# Patient Record
Sex: Male | Born: 1935 | Race: White | Hispanic: No | Marital: Married | Smoking: Former smoker
Health system: Southern US, Community
[De-identification: ages and names within clinical notes are randomized; demographics above are authoritative.]

## PROBLEM LIST (undated history)

## (undated) DIAGNOSIS — E669 Obesity, unspecified: Secondary | ICD-10-CM

## (undated) DIAGNOSIS — I255 Ischemic cardiomyopathy: Secondary | ICD-10-CM

## (undated) DIAGNOSIS — I2510 Atherosclerotic heart disease of native coronary artery without angina pectoris: Secondary | ICD-10-CM

## (undated) DIAGNOSIS — I701 Atherosclerosis of renal artery: Secondary | ICD-10-CM

## (undated) DIAGNOSIS — K219 Gastro-esophageal reflux disease without esophagitis: Secondary | ICD-10-CM

## (undated) DIAGNOSIS — I472 Ventricular tachycardia, unspecified: Secondary | ICD-10-CM

## (undated) DIAGNOSIS — Z95810 Presence of automatic (implantable) cardiac defibrillator: Secondary | ICD-10-CM

## (undated) DIAGNOSIS — E785 Hyperlipidemia, unspecified: Secondary | ICD-10-CM

## (undated) DIAGNOSIS — E1140 Type 2 diabetes mellitus with diabetic neuropathy, unspecified: Secondary | ICD-10-CM

## (undated) DIAGNOSIS — I350 Nonrheumatic aortic (valve) stenosis: Secondary | ICD-10-CM

## (undated) DIAGNOSIS — I10 Essential (primary) hypertension: Secondary | ICD-10-CM

## (undated) DIAGNOSIS — I739 Peripheral vascular disease, unspecified: Secondary | ICD-10-CM

## (undated) DIAGNOSIS — F329 Major depressive disorder, single episode, unspecified: Secondary | ICD-10-CM

## (undated) DIAGNOSIS — M1990 Unspecified osteoarthritis, unspecified site: Secondary | ICD-10-CM

## (undated) DIAGNOSIS — F32A Depression, unspecified: Secondary | ICD-10-CM

## (undated) DIAGNOSIS — J329 Chronic sinusitis, unspecified: Secondary | ICD-10-CM

## (undated) DIAGNOSIS — E049 Nontoxic goiter, unspecified: Secondary | ICD-10-CM

## (undated) HISTORY — DX: Depression, unspecified: F32.A

## (undated) HISTORY — DX: Major depressive disorder, single episode, unspecified: F32.9

## (undated) HISTORY — DX: Ischemic cardiomyopathy: I25.5

## (undated) HISTORY — DX: Gastro-esophageal reflux disease without esophagitis: K21.9

## (undated) HISTORY — DX: Ventricular tachycardia, unspecified: I47.20

## (undated) HISTORY — DX: Peripheral vascular disease, unspecified: I73.9

## (undated) HISTORY — DX: Presence of automatic (implantable) cardiac defibrillator: Z95.810

## (undated) HISTORY — PX: CORONARY ARTERY BYPASS GRAFT: SHX141

## (undated) HISTORY — DX: Type 2 diabetes mellitus with diabetic neuropathy, unspecified: E11.40

## (undated) HISTORY — DX: Unspecified osteoarthritis, unspecified site: M19.90

## (undated) HISTORY — DX: Obesity, unspecified: E66.9

## (undated) HISTORY — DX: Essential (primary) hypertension: I10

## (undated) HISTORY — PX: OTHER SURGICAL HISTORY: SHX169

## (undated) HISTORY — DX: Nontoxic goiter, unspecified: E04.9

## (undated) HISTORY — DX: Atherosclerosis of renal artery: I70.1

## (undated) HISTORY — DX: Nonrheumatic aortic (valve) stenosis: I35.0

## (undated) HISTORY — DX: Atherosclerotic heart disease of native coronary artery without angina pectoris: I25.10

## (undated) HISTORY — DX: Hyperlipidemia, unspecified: E78.5

## (undated) HISTORY — DX: Ventricular tachycardia: I47.2

## (undated) HISTORY — DX: Chronic sinusitis, unspecified: J32.9

---

## 1997-12-31 ENCOUNTER — Other Ambulatory Visit: Admission: RE | Admit: 1997-12-31 | Discharge: 1997-12-31 | Payer: Self-pay | Admitting: Internal Medicine

## 1998-01-14 ENCOUNTER — Ambulatory Visit (HOSPITAL_COMMUNITY): Admission: RE | Admit: 1998-01-14 | Discharge: 1998-01-14 | Payer: Self-pay | Admitting: Internal Medicine

## 1998-09-16 ENCOUNTER — Inpatient Hospital Stay (HOSPITAL_COMMUNITY): Admission: EM | Admit: 1998-09-16 | Discharge: 1998-09-30 | Payer: Self-pay | Admitting: Emergency Medicine

## 1998-09-16 ENCOUNTER — Encounter: Payer: Self-pay | Admitting: Internal Medicine

## 1998-09-17 ENCOUNTER — Encounter: Payer: Self-pay | Admitting: Internal Medicine

## 1998-09-18 ENCOUNTER — Encounter: Payer: Self-pay | Admitting: Cardiology

## 1998-09-18 ENCOUNTER — Encounter: Payer: Self-pay | Admitting: Internal Medicine

## 1998-09-19 ENCOUNTER — Encounter: Payer: Self-pay | Admitting: Cardiology

## 1998-09-20 ENCOUNTER — Encounter: Payer: Self-pay | Admitting: Cardiology

## 1998-09-21 ENCOUNTER — Encounter: Payer: Self-pay | Admitting: Internal Medicine

## 1998-09-23 ENCOUNTER — Encounter: Payer: Self-pay | Admitting: Internal Medicine

## 1998-09-24 ENCOUNTER — Encounter: Payer: Self-pay | Admitting: Internal Medicine

## 1998-09-25 ENCOUNTER — Encounter: Payer: Self-pay | Admitting: Internal Medicine

## 1998-09-26 ENCOUNTER — Encounter: Payer: Self-pay | Admitting: Thoracic Surgery (Cardiothoracic Vascular Surgery)

## 1998-09-27 ENCOUNTER — Encounter: Payer: Self-pay | Admitting: Thoracic Surgery (Cardiothoracic Vascular Surgery)

## 1998-11-05 ENCOUNTER — Encounter (HOSPITAL_COMMUNITY): Admission: RE | Admit: 1998-11-05 | Discharge: 1999-02-03 | Payer: Self-pay | Admitting: Cardiology

## 1998-12-18 ENCOUNTER — Encounter: Payer: Self-pay | Admitting: Emergency Medicine

## 1998-12-18 ENCOUNTER — Inpatient Hospital Stay (HOSPITAL_COMMUNITY): Admission: EM | Admit: 1998-12-18 | Discharge: 1998-12-21 | Payer: Self-pay | Admitting: Emergency Medicine

## 1998-12-19 ENCOUNTER — Encounter: Payer: Self-pay | Admitting: Cardiology

## 1998-12-31 ENCOUNTER — Encounter: Admission: RE | Admit: 1998-12-31 | Discharge: 1999-03-10 | Payer: Self-pay | Admitting: Internal Medicine

## 1999-02-04 ENCOUNTER — Encounter: Admission: RE | Admit: 1999-02-04 | Discharge: 1999-03-10 | Payer: Self-pay | Admitting: Cardiology

## 1999-03-06 ENCOUNTER — Encounter: Admission: RE | Admit: 1999-03-06 | Discharge: 1999-03-10 | Payer: Self-pay | Admitting: Internal Medicine

## 1999-03-11 ENCOUNTER — Encounter: Admission: RE | Admit: 1999-03-11 | Discharge: 1999-06-09 | Payer: Self-pay | Admitting: Cardiology

## 1999-04-01 ENCOUNTER — Encounter: Admission: RE | Admit: 1999-04-01 | Discharge: 1999-06-30 | Payer: Self-pay | Admitting: Internal Medicine

## 1999-04-28 ENCOUNTER — Encounter (HOSPITAL_COMMUNITY): Admission: RE | Admit: 1999-04-28 | Discharge: 1999-07-27 | Payer: Self-pay | Admitting: Cardiology

## 1999-07-28 ENCOUNTER — Encounter (HOSPITAL_COMMUNITY): Admission: RE | Admit: 1999-07-28 | Discharge: 1999-10-26 | Payer: Self-pay | Admitting: Cardiology

## 1999-10-27 ENCOUNTER — Encounter (HOSPITAL_COMMUNITY): Admission: RE | Admit: 1999-10-27 | Discharge: 2000-01-25 | Payer: Self-pay | Admitting: Cardiology

## 2000-01-26 ENCOUNTER — Encounter (HOSPITAL_COMMUNITY): Admission: RE | Admit: 2000-01-26 | Discharge: 2000-04-25 | Payer: Self-pay | Admitting: Cardiology

## 2000-04-26 ENCOUNTER — Encounter: Admission: RE | Admit: 2000-04-26 | Discharge: 2000-07-25 | Payer: Self-pay | Admitting: Rehabilitation

## 2000-05-03 ENCOUNTER — Encounter (INDEPENDENT_AMBULATORY_CARE_PROVIDER_SITE_OTHER): Payer: Self-pay | Admitting: *Deleted

## 2000-05-03 ENCOUNTER — Ambulatory Visit (HOSPITAL_COMMUNITY): Admission: RE | Admit: 2000-05-03 | Discharge: 2000-05-03 | Payer: Self-pay | Admitting: Gastroenterology

## 2000-06-04 ENCOUNTER — Encounter: Admission: RE | Admit: 2000-06-04 | Discharge: 2000-06-04 | Payer: Self-pay | Admitting: Internal Medicine

## 2000-06-04 ENCOUNTER — Encounter: Payer: Self-pay | Admitting: Internal Medicine

## 2000-07-26 ENCOUNTER — Encounter (HOSPITAL_COMMUNITY): Admission: RE | Admit: 2000-07-26 | Discharge: 2000-10-24 | Payer: Self-pay | Admitting: Cardiology

## 2000-08-30 ENCOUNTER — Encounter: Payer: Self-pay | Admitting: Cardiology

## 2000-08-30 ENCOUNTER — Inpatient Hospital Stay (HOSPITAL_COMMUNITY): Admission: EM | Admit: 2000-08-30 | Discharge: 2000-09-07 | Payer: Self-pay | Admitting: Emergency Medicine

## 2000-08-31 ENCOUNTER — Encounter: Payer: Self-pay | Admitting: Cardiology

## 2000-09-03 ENCOUNTER — Encounter: Payer: Self-pay | Admitting: Cardiology

## 2000-09-04 ENCOUNTER — Encounter: Payer: Self-pay | Admitting: Cardiology

## 2000-09-07 ENCOUNTER — Encounter: Payer: Self-pay | Admitting: Internal Medicine

## 2000-09-22 ENCOUNTER — Encounter: Payer: Self-pay | Admitting: Emergency Medicine

## 2000-09-22 ENCOUNTER — Emergency Department (HOSPITAL_COMMUNITY): Admission: EM | Admit: 2000-09-22 | Discharge: 2000-09-22 | Payer: Self-pay | Admitting: Emergency Medicine

## 2000-10-01 ENCOUNTER — Encounter: Payer: Self-pay | Admitting: Internal Medicine

## 2000-10-01 ENCOUNTER — Encounter: Admission: RE | Admit: 2000-10-01 | Discharge: 2000-10-01 | Payer: Self-pay | Admitting: Internal Medicine

## 2000-10-01 ENCOUNTER — Inpatient Hospital Stay (HOSPITAL_COMMUNITY): Admission: EM | Admit: 2000-10-01 | Discharge: 2000-10-06 | Payer: Self-pay | Admitting: Internal Medicine

## 2000-10-02 ENCOUNTER — Encounter: Payer: Self-pay | Admitting: Internal Medicine

## 2000-10-03 ENCOUNTER — Encounter: Payer: Self-pay | Admitting: Internal Medicine

## 2000-10-05 ENCOUNTER — Encounter: Payer: Self-pay | Admitting: Internal Medicine

## 2000-11-15 ENCOUNTER — Encounter (HOSPITAL_COMMUNITY): Admission: RE | Admit: 2000-11-15 | Discharge: 2001-02-13 | Payer: Self-pay | Admitting: Cardiology

## 2001-02-14 ENCOUNTER — Encounter (HOSPITAL_COMMUNITY): Admission: RE | Admit: 2001-02-14 | Discharge: 2001-05-15 | Payer: Self-pay | Admitting: Rehabilitation

## 2001-06-10 ENCOUNTER — Encounter (HOSPITAL_COMMUNITY): Admission: RE | Admit: 2001-06-10 | Discharge: 2001-09-08 | Payer: Self-pay | Admitting: Cardiology

## 2001-06-20 ENCOUNTER — Ambulatory Visit (HOSPITAL_COMMUNITY): Admission: RE | Admit: 2001-06-20 | Discharge: 2001-06-20 | Payer: Self-pay | Admitting: Cardiology

## 2001-06-20 ENCOUNTER — Encounter: Payer: Self-pay | Admitting: Cardiology

## 2001-09-10 ENCOUNTER — Encounter: Admission: RE | Admit: 2001-09-10 | Discharge: 2001-12-09 | Payer: Self-pay | Admitting: Cardiology

## 2001-12-10 ENCOUNTER — Encounter (HOSPITAL_COMMUNITY): Admission: RE | Admit: 2001-12-10 | Discharge: 2002-03-10 | Payer: Self-pay | Admitting: Cardiology

## 2002-03-21 ENCOUNTER — Encounter: Payer: Self-pay | Admitting: Vascular Surgery

## 2002-03-23 ENCOUNTER — Ambulatory Visit (HOSPITAL_COMMUNITY): Admission: RE | Admit: 2002-03-23 | Discharge: 2002-03-23 | Payer: Self-pay | Admitting: Vascular Surgery

## 2002-03-30 ENCOUNTER — Encounter: Payer: Self-pay | Admitting: Vascular Surgery

## 2002-03-30 ENCOUNTER — Inpatient Hospital Stay (HOSPITAL_COMMUNITY): Admission: RE | Admit: 2002-03-30 | Discharge: 2002-04-03 | Payer: Self-pay | Admitting: Vascular Surgery

## 2002-05-10 ENCOUNTER — Encounter (HOSPITAL_COMMUNITY): Admission: RE | Admit: 2002-05-10 | Discharge: 2002-08-08 | Payer: Self-pay | Admitting: Cardiology

## 2002-08-10 ENCOUNTER — Encounter (HOSPITAL_COMMUNITY): Admission: RE | Admit: 2002-08-10 | Discharge: 2002-11-08 | Payer: Self-pay | Admitting: Cardiology

## 2002-08-14 ENCOUNTER — Encounter: Payer: Self-pay | Admitting: Vascular Surgery

## 2002-08-16 ENCOUNTER — Ambulatory Visit (HOSPITAL_COMMUNITY): Admission: RE | Admit: 2002-08-16 | Discharge: 2002-08-17 | Payer: Self-pay | Admitting: Vascular Surgery

## 2002-11-09 ENCOUNTER — Encounter (HOSPITAL_COMMUNITY): Admission: RE | Admit: 2002-11-09 | Discharge: 2003-02-07 | Payer: Self-pay | Admitting: Cardiology

## 2003-01-30 ENCOUNTER — Encounter: Payer: Self-pay | Admitting: Cardiology

## 2003-01-30 ENCOUNTER — Emergency Department (HOSPITAL_COMMUNITY): Admission: EM | Admit: 2003-01-30 | Discharge: 2003-01-30 | Payer: Self-pay | Admitting: Emergency Medicine

## 2003-01-30 ENCOUNTER — Encounter: Payer: Self-pay | Admitting: Emergency Medicine

## 2003-02-08 ENCOUNTER — Encounter (HOSPITAL_COMMUNITY): Admission: RE | Admit: 2003-02-08 | Discharge: 2003-05-09 | Payer: Self-pay | Admitting: Cardiology

## 2003-05-11 ENCOUNTER — Emergency Department (HOSPITAL_COMMUNITY): Admission: EM | Admit: 2003-05-11 | Discharge: 2003-05-11 | Payer: Self-pay | Admitting: Emergency Medicine

## 2003-05-11 ENCOUNTER — Encounter (HOSPITAL_COMMUNITY): Admission: RE | Admit: 2003-05-11 | Discharge: 2003-08-09 | Payer: Self-pay | Admitting: Cardiology

## 2003-05-11 ENCOUNTER — Encounter: Payer: Self-pay | Admitting: Emergency Medicine

## 2003-08-11 ENCOUNTER — Encounter (HOSPITAL_COMMUNITY): Admission: RE | Admit: 2003-08-11 | Discharge: 2003-11-09 | Payer: Self-pay | Admitting: Cardiology

## 2003-11-10 ENCOUNTER — Encounter (HOSPITAL_COMMUNITY): Admission: RE | Admit: 2003-11-10 | Discharge: 2004-02-08 | Payer: Self-pay | Admitting: Cardiology

## 2003-11-15 ENCOUNTER — Encounter: Admission: RE | Admit: 2003-11-15 | Discharge: 2003-12-04 | Payer: Self-pay | Admitting: Internal Medicine

## 2004-02-12 ENCOUNTER — Encounter (HOSPITAL_COMMUNITY): Admission: RE | Admit: 2004-02-12 | Discharge: 2004-05-12 | Payer: Self-pay | Admitting: Cardiology

## 2004-02-12 ENCOUNTER — Emergency Department (HOSPITAL_COMMUNITY): Admission: EM | Admit: 2004-02-12 | Discharge: 2004-02-12 | Payer: Self-pay | Admitting: Emergency Medicine

## 2004-05-13 ENCOUNTER — Encounter (HOSPITAL_COMMUNITY): Admission: RE | Admit: 2004-05-13 | Discharge: 2004-08-11 | Payer: Self-pay | Admitting: Cardiology

## 2004-08-12 ENCOUNTER — Encounter (HOSPITAL_COMMUNITY): Admission: RE | Admit: 2004-08-12 | Discharge: 2004-11-10 | Payer: Self-pay | Admitting: Cardiology

## 2004-08-15 ENCOUNTER — Ambulatory Visit: Payer: Self-pay | Admitting: Internal Medicine

## 2004-11-04 ENCOUNTER — Encounter: Admission: RE | Admit: 2004-11-04 | Discharge: 2004-11-04 | Payer: Self-pay | Admitting: Internal Medicine

## 2005-01-02 ENCOUNTER — Encounter: Admission: RE | Admit: 2005-01-02 | Discharge: 2005-01-02 | Payer: Self-pay | Admitting: Internal Medicine

## 2005-03-12 ENCOUNTER — Ambulatory Visit: Payer: Self-pay | Admitting: "Endocrinology

## 2005-03-18 ENCOUNTER — Ambulatory Visit (HOSPITAL_COMMUNITY): Admission: RE | Admit: 2005-03-18 | Discharge: 2005-03-18 | Payer: Self-pay | Admitting: Neurological Surgery

## 2005-03-20 ENCOUNTER — Ambulatory Visit (HOSPITAL_COMMUNITY): Admission: RE | Admit: 2005-03-20 | Discharge: 2005-03-20 | Payer: Self-pay | Admitting: Neurological Surgery

## 2005-03-23 ENCOUNTER — Ambulatory Visit (HOSPITAL_COMMUNITY): Admission: RE | Admit: 2005-03-23 | Discharge: 2005-03-23 | Payer: Self-pay | Admitting: Neurological Surgery

## 2005-03-26 ENCOUNTER — Ambulatory Visit: Payer: Self-pay | Admitting: "Endocrinology

## 2005-04-02 ENCOUNTER — Ambulatory Visit: Payer: Self-pay | Admitting: "Endocrinology

## 2005-04-08 ENCOUNTER — Encounter: Admission: RE | Admit: 2005-04-08 | Discharge: 2005-07-07 | Payer: Self-pay | Admitting: *Deleted

## 2005-04-14 ENCOUNTER — Inpatient Hospital Stay (HOSPITAL_COMMUNITY): Admission: RE | Admit: 2005-04-14 | Discharge: 2005-04-15 | Payer: Self-pay | Admitting: Neurological Surgery

## 2005-05-12 ENCOUNTER — Ambulatory Visit: Payer: Self-pay | Admitting: "Endocrinology

## 2005-06-15 ENCOUNTER — Ambulatory Visit: Payer: Self-pay | Admitting: "Endocrinology

## 2005-09-29 ENCOUNTER — Ambulatory Visit: Payer: Self-pay | Admitting: "Endocrinology

## 2005-12-03 ENCOUNTER — Ambulatory Visit (HOSPITAL_COMMUNITY): Admission: RE | Admit: 2005-12-03 | Discharge: 2005-12-03 | Payer: Self-pay | Admitting: Vascular Surgery

## 2005-12-31 ENCOUNTER — Ambulatory Visit: Payer: Self-pay | Admitting: "Endocrinology

## 2006-06-14 ENCOUNTER — Ambulatory Visit: Payer: Self-pay | Admitting: "Endocrinology

## 2006-07-27 ENCOUNTER — Ambulatory Visit: Payer: Self-pay | Admitting: Internal Medicine

## 2006-08-23 ENCOUNTER — Ambulatory Visit: Payer: Self-pay | Admitting: "Endocrinology

## 2006-10-04 ENCOUNTER — Ambulatory Visit: Payer: Self-pay | Admitting: Vascular Surgery

## 2006-10-28 ENCOUNTER — Ambulatory Visit: Payer: Self-pay | Admitting: "Endocrinology

## 2007-02-15 ENCOUNTER — Ambulatory Visit: Payer: Self-pay | Admitting: "Endocrinology

## 2007-05-18 ENCOUNTER — Ambulatory Visit: Payer: Self-pay | Admitting: Internal Medicine

## 2007-05-25 ENCOUNTER — Ambulatory Visit: Payer: Self-pay | Admitting: "Endocrinology

## 2007-09-14 ENCOUNTER — Ambulatory Visit: Payer: Self-pay | Admitting: "Endocrinology

## 2007-10-03 ENCOUNTER — Ambulatory Visit: Payer: Self-pay | Admitting: Vascular Surgery

## 2007-12-27 ENCOUNTER — Ambulatory Visit: Payer: Self-pay | Admitting: "Endocrinology

## 2007-12-27 ENCOUNTER — Encounter: Admission: RE | Admit: 2007-12-27 | Discharge: 2007-12-27 | Payer: Self-pay | Admitting: "Endocrinology

## 2008-04-03 ENCOUNTER — Ambulatory Visit: Payer: Self-pay | Admitting: Vascular Surgery

## 2008-05-07 ENCOUNTER — Ambulatory Visit: Payer: Self-pay | Admitting: "Endocrinology

## 2008-05-29 ENCOUNTER — Ambulatory Visit: Payer: Self-pay | Admitting: Internal Medicine

## 2008-06-05 ENCOUNTER — Ambulatory Visit: Payer: Self-pay | Admitting: Internal Medicine

## 2008-06-12 ENCOUNTER — Ambulatory Visit: Payer: Self-pay | Admitting: Internal Medicine

## 2008-06-12 LAB — CONVERTED CEMR LAB
BUN: 26 mg/dL — ABNORMAL HIGH (ref 6–23)
Basophils Absolute: 0 10*3/uL (ref 0.0–0.1)
Basophils Relative: 0.2 % (ref 0.0–3.0)
CO2: 29 meq/L (ref 19–32)
Calcium: 9 mg/dL (ref 8.4–10.5)
Chloride: 101 meq/L (ref 96–112)
Creatinine, Ser: 1.7 mg/dL — ABNORMAL HIGH (ref 0.4–1.5)
Eosinophils Absolute: 0.5 10*3/uL (ref 0.0–0.7)
Eosinophils Relative: 5.8 % — ABNORMAL HIGH (ref 0.0–5.0)
GFR calc Af Amer: 50 mL/min
GFR calc non Af Amer: 41 mL/min
Glucose, Bld: 256 mg/dL — ABNORMAL HIGH (ref 70–99)
HCT: 37.7 % — ABNORMAL LOW (ref 39.0–52.0)
Hemoglobin: 13 g/dL (ref 13.0–17.0)
INR: 1 (ref 0.8–1.0)
Lymphocytes Relative: 25.7 % (ref 12.0–46.0)
MCHC: 34.4 g/dL (ref 30.0–36.0)
MCV: 89.5 fL (ref 78.0–100.0)
Monocytes Absolute: 0.6 10*3/uL (ref 0.1–1.0)
Monocytes Relative: 7.3 % (ref 3.0–12.0)
Neutro Abs: 5.3 10*3/uL (ref 1.4–7.7)
Neutrophils Relative %: 61 % (ref 43.0–77.0)
Platelets: 217 10*3/uL (ref 150–400)
Potassium: 4.4 meq/L (ref 3.5–5.1)
Prothrombin Time: 11.5 s (ref 10.9–13.3)
RBC: 4.22 M/uL (ref 4.22–5.81)
RDW: 13.9 % (ref 11.5–14.6)
Sodium: 138 meq/L (ref 135–145)
WBC: 8.6 10*3/uL (ref 4.5–10.5)
aPTT: 30 s — ABNORMAL HIGH (ref 21.7–29.8)

## 2008-06-19 ENCOUNTER — Ambulatory Visit: Payer: Self-pay | Admitting: Internal Medicine

## 2008-06-19 ENCOUNTER — Observation Stay (HOSPITAL_COMMUNITY): Admission: RE | Admit: 2008-06-19 | Discharge: 2008-06-20 | Payer: Self-pay | Admitting: Internal Medicine

## 2008-07-16 ENCOUNTER — Ambulatory Visit: Payer: Self-pay

## 2008-07-26 ENCOUNTER — Ambulatory Visit: Payer: Self-pay | Admitting: Internal Medicine

## 2008-08-16 ENCOUNTER — Ambulatory Visit: Payer: Self-pay | Admitting: "Endocrinology

## 2008-09-18 ENCOUNTER — Ambulatory Visit: Payer: Self-pay | Admitting: Internal Medicine

## 2008-09-19 ENCOUNTER — Encounter: Payer: Self-pay | Admitting: Internal Medicine

## 2008-11-01 ENCOUNTER — Ambulatory Visit: Payer: Self-pay | Admitting: Internal Medicine

## 2008-12-08 DIAGNOSIS — E119 Type 2 diabetes mellitus without complications: Secondary | ICD-10-CM

## 2008-12-08 DIAGNOSIS — I509 Heart failure, unspecified: Secondary | ICD-10-CM | POA: Insufficient documentation

## 2008-12-08 DIAGNOSIS — I4510 Unspecified right bundle-branch block: Secondary | ICD-10-CM

## 2008-12-08 DIAGNOSIS — I469 Cardiac arrest, cause unspecified: Secondary | ICD-10-CM

## 2008-12-08 DIAGNOSIS — J449 Chronic obstructive pulmonary disease, unspecified: Secondary | ICD-10-CM

## 2008-12-08 DIAGNOSIS — K219 Gastro-esophageal reflux disease without esophagitis: Secondary | ICD-10-CM

## 2008-12-08 DIAGNOSIS — I749 Embolism and thrombosis of unspecified artery: Secondary | ICD-10-CM | POA: Insufficient documentation

## 2008-12-08 DIAGNOSIS — I701 Atherosclerosis of renal artery: Secondary | ICD-10-CM | POA: Insufficient documentation

## 2008-12-08 DIAGNOSIS — E785 Hyperlipidemia, unspecified: Secondary | ICD-10-CM | POA: Insufficient documentation

## 2008-12-08 DIAGNOSIS — I2589 Other forms of chronic ischemic heart disease: Secondary | ICD-10-CM

## 2008-12-08 DIAGNOSIS — M1990 Unspecified osteoarthritis, unspecified site: Secondary | ICD-10-CM | POA: Insufficient documentation

## 2008-12-08 DIAGNOSIS — J4489 Other specified chronic obstructive pulmonary disease: Secondary | ICD-10-CM | POA: Insufficient documentation

## 2008-12-21 ENCOUNTER — Ambulatory Visit: Payer: Self-pay | Admitting: Internal Medicine

## 2008-12-24 ENCOUNTER — Ambulatory Visit: Payer: Self-pay | Admitting: Internal Medicine

## 2009-01-15 ENCOUNTER — Ambulatory Visit: Payer: Self-pay | Admitting: Internal Medicine

## 2009-01-18 ENCOUNTER — Encounter: Payer: Self-pay | Admitting: Internal Medicine

## 2009-02-08 ENCOUNTER — Ambulatory Visit: Payer: Self-pay | Admitting: Internal Medicine

## 2009-05-15 ENCOUNTER — Ambulatory Visit: Payer: Self-pay | Admitting: "Endocrinology

## 2009-05-15 ENCOUNTER — Ambulatory Visit: Payer: Self-pay | Admitting: Internal Medicine

## 2009-06-04 ENCOUNTER — Ambulatory Visit: Payer: Self-pay | Admitting: Internal Medicine

## 2009-07-15 ENCOUNTER — Ambulatory Visit: Payer: Self-pay | Admitting: Internal Medicine

## 2009-08-29 ENCOUNTER — Ambulatory Visit: Payer: Self-pay | Admitting: "Endocrinology

## 2009-12-10 ENCOUNTER — Encounter: Payer: Self-pay | Admitting: Internal Medicine

## 2009-12-10 ENCOUNTER — Ambulatory Visit: Payer: Self-pay | Admitting: Internal Medicine

## 2009-12-10 ENCOUNTER — Ambulatory Visit: Payer: Self-pay | Admitting: Vascular Surgery

## 2009-12-10 ENCOUNTER — Ambulatory Visit (HOSPITAL_COMMUNITY): Admission: RE | Admit: 2009-12-10 | Discharge: 2009-12-10 | Payer: Self-pay | Admitting: Internal Medicine

## 2010-01-02 ENCOUNTER — Ambulatory Visit: Payer: Self-pay | Admitting: "Endocrinology

## 2010-02-21 ENCOUNTER — Ambulatory Visit: Payer: Self-pay | Admitting: Internal Medicine

## 2010-03-18 ENCOUNTER — Encounter: Admission: RE | Admit: 2010-03-18 | Discharge: 2010-03-18 | Payer: Self-pay | Admitting: Orthopedic Surgery

## 2010-03-28 ENCOUNTER — Encounter (INDEPENDENT_AMBULATORY_CARE_PROVIDER_SITE_OTHER): Payer: Self-pay | Admitting: *Deleted

## 2010-05-01 ENCOUNTER — Ambulatory Visit: Payer: Self-pay | Admitting: Internal Medicine

## 2010-05-08 ENCOUNTER — Encounter: Payer: Self-pay | Admitting: Internal Medicine

## 2010-05-19 ENCOUNTER — Ambulatory Visit: Payer: Self-pay | Admitting: "Endocrinology

## 2010-07-01 ENCOUNTER — Ambulatory Visit: Payer: Self-pay | Admitting: Vascular Surgery

## 2010-07-15 ENCOUNTER — Ambulatory Visit: Payer: Self-pay | Admitting: Internal Medicine

## 2010-07-15 DIAGNOSIS — I5022 Chronic systolic (congestive) heart failure: Secondary | ICD-10-CM

## 2010-07-15 DIAGNOSIS — Z95810 Presence of automatic (implantable) cardiac defibrillator: Secondary | ICD-10-CM | POA: Insufficient documentation

## 2010-08-21 ENCOUNTER — Telehealth: Payer: Self-pay | Admitting: Internal Medicine

## 2010-08-28 ENCOUNTER — Ambulatory Visit
Admission: RE | Admit: 2010-08-28 | Discharge: 2010-08-28 | Payer: Self-pay | Source: Home / Self Care | Attending: Internal Medicine | Admitting: Internal Medicine

## 2010-09-02 ENCOUNTER — Encounter
Admission: RE | Admit: 2010-09-02 | Discharge: 2010-09-02 | Payer: Self-pay | Source: Home / Self Care | Attending: Internal Medicine | Admitting: Internal Medicine

## 2010-09-11 NOTE — Cardiovascular Report (Signed)
Summary: Office Visit   Office Visit   Imported By: Roderic Ovens 07/24/2010 11:59:23  _____________________________________________________________________  External Attachment:    Type:   Image     Comment:   External Document

## 2010-09-11 NOTE — Letter (Signed)
Summary: Device-Delinquent Check  [facility], Main Office  [address]   [place], [place] [address]   Phone: [phone]  Fax: [phone]     March 28, 2010 MRN: [id number]   Dylan Jenkins [address] [place], [place]  [address]   Dear Mr. Sockwell,  According to our records, you have not had your implanted device checked in the recommended period of time.  We are unable to determine appropriate device function without checking your device on a regular basis.  Please call our office to schedule an appointment, with Dr. Ladona Ridgel,  as soon as possible.  If you are having your device checked by another physician, please call us so that we may update our records.  Thank you, Trendon Zaring, LPN  March 28, 2010 1:49 PM   [facility]  certified mail

## 2010-09-11 NOTE — Cardiovascular Report (Signed)
Summary: Certified Letter Signed - Other (Appt. 07/15/10)  Certified Letter Signed - Other   Imported By: Debby Freiberg 06/09/2010 15:52:57  _____________________________________________________________________  External Attachment:    Type:   Image     Comment:   External Document

## 2010-09-11 NOTE — Progress Notes (Signed)
Summary: question re pt visits  Phone Note Call from Patient Call back at Home Phone [phone]   Caller: Spouse/AGNES Reason for Call: Talk to Nurse Summary of Call: pt wife has question re pt visits with dr. Ladona Ridgel. Initial call taken by: Roe Coombs,  August 21, 2010 11:58 AM  Follow-up for Phone Call        going to [facility] she needed to know what kind of device he has  Pt has a BiV ICD  Wife aware Dennis Bast, RN, BSN  August 21, 2010 2:50 PM

## 2010-09-11 NOTE — Assessment & Plan Note (Signed)
Summary: device ck/over due /mt   History of Present Illness: Mr. Dylan Jenkins returns today for followup.  He is a pleasant elderly man with a h/o ICM, CHF, class 2 CHF, and severe arthritis.  He notes that he has become increasingly sedentary and has difficulty ambulating secondary to severe pain in his back and hip.  He denies c/p, sob, or peripheral edema.  No ICD shocks, or syncope.  Current Medications (verified): 1)  Ramipril 10 Mg Caps (Ramipril) .... Take One Capsule By Mouth Two Times A Day 2)  Aspirin 81 Mg  Tabs (Aspirin) .Marland Kitchen.. 1 By Mouth Daily 3)  Furosemide 40 Mg Tabs (Furosemide) .... Take One Tablet By Mouth Daily. 4)  Nabumetone 500 Mg Tabs (Nabumetone) .... 2 By Mouth Dialy 5)  Glipizide 10 Mg Tabs (Glipizide) .... 2 By Mouth Two Times A Day 6)  Uroxatral 10 Mg Xr24h-Tab (Alfuzosin Hcl) .Marland Kitchen.. 1 By Mouth Daily 7)  Januvia 100 Mg Tabs (Sitagliptin Phosphate) .Marland Kitchen.. 1 By Mouth Daily 8)  Digoxin 0.125 Mg Tabs (Digoxin) .Marland Kitchen.. 1 By Mouth Daily 9)  Furosemide 40 Mg Tabs (Furosemide) .Marland Kitchen.. 1 By Mouth Daily 10)  Simvastatin 40 Mg Tabs (Simvastatin) .Marland Kitchen.. 1 By Mouth Daily 11)  Wellbutrin Xl 150 Mg Xr24h-Tab (Bupropion Hcl) .Marland Kitchen.. 1 By Mouth Daily 12)  Coreg 3.125 Mg Tabs (Carvedilol) .Marland Kitchen.. 1 By Mouth Two Times A Day 13)  Gabapentin 300 Mg Caps (Gabapentin) .Marland Kitchen.. 1-3 Times A Day 14)  Multivitamins   Tabs (Multiple Vitamin) .Marland Kitchen.. 1 By Mouth Daily 15)  Lantus 100 Unit/ml Soln (Insulin Glargine) .... Uad 16)  Promoday .... Uad 17)  Prilosec 20 Mg Cpdr (Omeprazole) .Marland Kitchen.. 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 Lewayne Bunting  06/19/2008   Status post right femoral to popliteal bypass for infrainguinal       arterial occlusive disease.  Stent to the left renal artery for renal artery stenosis.   coronary artery bypass surgery in 2000 peripheral arterial bypass surgery  Review of Systems  The patient denies chest pain, syncope, dyspnea on exertion, and peripheral edema.    Vital Signs:  Patient profile:   77 year old male Height:      67 inches Weight:      224 pounds BMI:     35.21 Pulse rate:   78 / minute Resp:     18 per minute BP sitting:   144 / 81  (right arm)  Vitals Entered By: Marrion Coy, CNA (July 15, 2010 12:35 PM)  Physical Exam  General:  Well developed, well nourished, in no acute distress. Looks younger than his stated age. HEENT: normal Neck: supple. No JVD. Carotids 2+ bilaterally no bruits Cor: RRR no rubs, gallops or murmur Lungs: CTA Ab: soft, nontender. nondistended. No HSM. Good bowel sounds Ext: warm. no cyanosis, clubbing. Trace edema. Neuro: alert and oriented. Grossly nonfocal. affect pleasant     ICD Specifications Following MD:  Lewayne Bunting, MD     ICD Vendor:  [employer]     ICD Model Number:  [id number]     ICD Serial Number:  [id number] ICD DOI:  06/19/2008      Lead 1:    Location: RA     DOI: 06/19/2008     Model #: [id number]     Serial #: [id number]     Status: active Lead 2:    Location: RV     DOI: 09/06/2000     Model #: [id number]     Serial #: [id number]     Status: active Lead 3:    Location: LV     DOI: 06/19/2008     Model #: [id number]     Serial #: [id number]     Status: active  Indications::  ICM   ICD Follow Up Battery Voltage:  3.09 V     Charge Time:  9.8 seconds     Underlying rhythm:  SR   ICD Device Measurements Atrium:  Amplitude: 2.7 mV, Impedance: 552 ohms, Threshold: 0.5 V at 0.4  msec Right Ventricle:  Amplitude: 9.0 mV, Impedance: 560 ohms, Threshold: 1.5 V at 0.4 msec Left Ventricle:  Impedance: 344 ohms, Threshold: 2.0 V at 0.8 msec Shock Impedance: 49/60 ohms   Episodes MS Episodes:  0     Shock:  0     ATP:  0     Nonsustained:  3     Atrial Pacing:  1.2%     Ventricular Pacing:  96%  Brady Parameters Mode DDD     Lower Rate Limit:  50     Upper Rate Limit 130 PAV 180     Sensed AV Delay:  160  Tachy Zones VF:  207     VT1:  176-207     Next Remote Date:  10/16/2010     Next Cardiology Appt Due:  07/13/2011 Tech Comments:  3 NST EPISODES.  NORMAL DEVICE FUNCTION.  OPTIVOL STABLE.  TURNED ON 1:1 SVT AND CHANGED RV PULSE WIDTH 0.4 TO 0.6 AND LV OUTPUT 4.0 TO 3.0 AND LV PULSE WIDTH FROM 0.4 TO 0.8.  CHANGED MAX LEAD IMPEDANCES FOR LIA.  PT WOULD LIKE TO BE ENROLLED IN CARELINK.  CARELINK 10-16-10 AND ROV IN 12 MTHS W/DEVICE CLINIC. Vella Kohler  July 15, 2010 12:56 PM MD Comments:  Agree with above.  Impression & Recommendations:  Problem # 1:  CARDIOMYOPATHY, ISCHEMIC (ICD-414.8) He denies anginal symptoms.  Continue meds as below. The following medications were removed from the medication list:    Digoxin 0.125 Mg Tabs (Digoxin) .Marland Kitchen... Take a half  tablet by mouth daily    Metoprolol Succinate 25 Mg Xr24h-tab (Metoprolol succinate) .Marland Kitchen... Take one tablet by mouth daily His updated medication list for this problem includes:    Ramipril 10 Mg Caps (Ramipril) .Marland Kitchen... Take one capsule by mouth two times a day    Aspirin 81 Mg Tabs (Aspirin) .Marland Kitchen... 1 by mouth daily    Furosemide 40 Mg Tabs (Furosemide) .Marland Kitchen... Take one tablet by mouth daily.    Digoxin 0.125 Mg Tabs (Digoxin) .Marland Kitchen... 1 by mouth daily    Furosemide 40 Mg Tabs (Furosemide) .Marland Kitchen... 1 by mouth daily    Coreg 3.125 Mg Tabs (Carvedilol) .Marland Kitchen... 1 by mouth two times a day  Problem # 2:  CHRONIC SYSTOLIC HEART FAILURE (ICD-428.22) His symptoms appear to be class 2.  Continue meds as below and a low sodium  diet. The following medications were removed from the medication list:    Digoxin 0.125 Mg Tabs (Digoxin) .Marland Kitchen... Take a half  tablet by mouth daily    Metoprolol Succinate  25 Mg Xr24h-tab (Metoprolol succinate) .Marland Kitchen... Take one tablet by mouth daily His updated medication list for this problem includes:    Ramipril 10 Mg Caps (Ramipril) .Marland Kitchen... Take one capsule by mouth two times a day    Aspirin 81 Mg Tabs (Aspirin) .Marland Kitchen... 1 by mouth daily    Furosemide 40 Mg Tabs (Furosemide) .Marland Kitchen... Take one tablet by mouth daily.    Digoxin 0.125 Mg Tabs (Digoxin) .Marland Kitchen... 1 by mouth daily    Furosemide 40 Mg Tabs (Furosemide) .Marland Kitchen... 1 by mouth daily    Coreg 3.125 Mg Tabs (Carvedilol) .Marland Kitchen... 1 by mouth two times a day  Problem # 3:  AUTOMATIC IMPLANTABLE CARDIAC DEFIBRILLATOR SITU (ICD-V45.02) His device is working normally.  Will recheck in several months.  Patient Instructions: 1)  Your physician wants you to follow-up in: 12 months with Dr Court Joy will receive a reminder letter in the mail two months in advance. If you don't receive a letter, please call our office to schedule the follow-up appointment.

## 2010-09-22 ENCOUNTER — Ambulatory Visit (INDEPENDENT_AMBULATORY_CARE_PROVIDER_SITE_OTHER): Payer: MEDICARE | Admitting: "Endocrinology

## 2010-09-22 DIAGNOSIS — E1142 Type 2 diabetes mellitus with diabetic polyneuropathy: Secondary | ICD-10-CM

## 2010-09-22 DIAGNOSIS — E049 Nontoxic goiter, unspecified: Secondary | ICD-10-CM

## 2010-09-22 DIAGNOSIS — I10 Essential (primary) hypertension: Secondary | ICD-10-CM

## 2010-09-22 DIAGNOSIS — R609 Edema, unspecified: Secondary | ICD-10-CM

## 2010-09-22 DIAGNOSIS — E1065 Type 1 diabetes mellitus with hyperglycemia: Secondary | ICD-10-CM

## 2010-10-21 ENCOUNTER — Encounter: Payer: Self-pay | Admitting: *Deleted

## 2010-10-28 NOTE — Letter (Signed)
Summary: Device-Delinquent Phone Journalist, newspaper, Main Office  [address]   [place], [place] [address]   Phone: [phone]  Fax: [phone]     October 21, 2010 MRN: [id number]   Dylan Jenkins [address] [place], [place]  [address]   Dear Mr. Morandi,  According to our records, you were scheduled for a device phone transmission on 10-16-2010.     We did not receive any results from this check.  If you transmitted on your scheduled day, please call us to help troubleshoot your system.  If you forgot to send your transmission, please send one upon receipt of this letter.  Thank you,   [facility]

## 2010-12-01 ENCOUNTER — Other Ambulatory Visit: Payer: Self-pay | Admitting: *Deleted

## 2010-12-01 ENCOUNTER — Encounter: Payer: Self-pay | Admitting: *Deleted

## 2010-12-23 ENCOUNTER — Encounter: Payer: Self-pay | Admitting: "Endocrinology

## 2010-12-23 ENCOUNTER — Ambulatory Visit (INDEPENDENT_AMBULATORY_CARE_PROVIDER_SITE_OTHER): Payer: MEDICARE | Admitting: "Endocrinology

## 2010-12-23 VITALS — BP 123/65 | HR 81 | Wt 211.2 lb

## 2010-12-23 DIAGNOSIS — E1169 Type 2 diabetes mellitus with other specified complication: Secondary | ICD-10-CM

## 2010-12-23 DIAGNOSIS — E049 Nontoxic goiter, unspecified: Secondary | ICD-10-CM | POA: Insufficient documentation

## 2010-12-23 DIAGNOSIS — E1142 Type 2 diabetes mellitus with diabetic polyneuropathy: Secondary | ICD-10-CM

## 2010-12-23 DIAGNOSIS — I2510 Atherosclerotic heart disease of native coronary artery without angina pectoris: Secondary | ICD-10-CM | POA: Insufficient documentation

## 2010-12-23 DIAGNOSIS — I739 Peripheral vascular disease, unspecified: Secondary | ICD-10-CM | POA: Insufficient documentation

## 2010-12-23 DIAGNOSIS — E1165 Type 2 diabetes mellitus with hyperglycemia: Secondary | ICD-10-CM

## 2010-12-23 DIAGNOSIS — E1140 Type 2 diabetes mellitus with diabetic neuropathy, unspecified: Secondary | ICD-10-CM | POA: Insufficient documentation

## 2010-12-23 DIAGNOSIS — E11649 Type 2 diabetes mellitus with hypoglycemia without coma: Secondary | ICD-10-CM

## 2010-12-23 DIAGNOSIS — I999 Unspecified disorder of circulatory system: Secondary | ICD-10-CM

## 2010-12-23 DIAGNOSIS — E1149 Type 2 diabetes mellitus with other diabetic neurological complication: Secondary | ICD-10-CM

## 2010-12-23 DIAGNOSIS — F329 Major depressive disorder, single episode, unspecified: Secondary | ICD-10-CM | POA: Insufficient documentation

## 2010-12-23 DIAGNOSIS — I10 Essential (primary) hypertension: Secondary | ICD-10-CM

## 2010-12-23 LAB — POCT GLYCOSYLATED HEMOGLOBIN (HGB A1C): Hemoglobin A1C: 7.2

## 2010-12-23 LAB — GLUCOSE, POCT (MANUAL RESULT ENTRY): POC Glucose: 201

## 2010-12-23 NOTE — Procedures (Signed)
BYPASS GRAFT EVALUATION   INDICATION:  Followup right leg bypass graft.   HISTORY:  Diabetes:  Yes, orally controlled.  Cardiac:  CABG on 09/24/1998, pacemaker.  Hypertension:  Yes.  Smoking:  Quit in 1989.  Previous Surgery:  Right femoral-to-popliteal artery bypass graft with  Gore-Tex on 03/30/2002 by Dr. Hart Rochester.   SINGLE LEVEL ARTERIAL EXAM                               RIGHT              LEFT  Brachial:                    140                140  Anterior tibial:             80                 Inaudible  Posterior tibial:            120                100  Peroneal:                                       Inaudible  Ankle/brachial index:        0.86               0.71   PREVIOUS ABI:  Date: 10/04/2006  RIGHT:  0.76  LEFT:  0.67   LOWER EXTREMITY BYPASS GRAFT DUPLEX EXAM:   DUPLEX:  Doppler arterial wave forms are biphasic proximal to,  throughout and distal to the graft.  Elevated velocities distal to the  graft in the native artery are stable at 261 cm/s.   IMPRESSION:  1. Patent right femoral-to-popliteal artery bypass graft.  2. Right ABI improved, left ABI stable from previous exam.        ___________________________________________  Dylan Jenkins. Hart Rochester, M.D.   DP/MEDQ  D:  10/03/2007  T:  10/03/2007  Job:  [id number]

## 2010-12-23 NOTE — Procedures (Signed)
BYPASS GRAFT EVALUATION   INDICATION:  Followup, right lower extremity bypass graft.   HISTORY:  Diabetes:  Yes.  Cardiac:  CABG, pacemaker.  Hypertension:  Yes.  Smoking:  Previous.  Previous Surgery:  Right fem-pop bypass graft on 03/30/02.   SINGLE LEVEL ARTERIAL EXAM                               RIGHT              LEFT  Brachial:                    147                150  Anterior tibial:             106                92  Posterior tibial:            126                97  Peroneal:  Ankle/brachial index:        0.84               0.65   PREVIOUS ABI:  Date: 10/03/07  RIGHT:  0.86  LEFT:  0.71   LOWER EXTREMITY BYPASS GRAFT DUPLEX EXAM:   DUPLEX:  1. Biphasic Doppler waveforms noted throughout the right lower      extremity bypass graft and its native vessels with mildly increased      velocities noted at the common femoral artery and proximal      anastomosis levels of 201 cm/s and 185 cm/s respectively.  2. The previously recorded increased velocity of the right distal      native vessel was not adequately visualized due to technical      limitations.   IMPRESSION:  1. Patent right femoropopliteal bypass graft with increased velocities      noted, as described above.  2. Stable bilateral ankle brachial indices noted.  3. No significant change noted since the previous examination on      10/03/07, based on limited visualization.      ___________________________________________  Dylan Jenkins, M.D.   CH/MEDQ  D:  04/03/2008  T:  04/03/2008  Job:  [id number]

## 2010-12-23 NOTE — Consult Note (Signed)
NEW PATIENT CONSULTATION   Dylan Jenkins, Dylan Jenkins  DOB:  [birth year]                                       07/01/2010  JXBJY#:[id number]   The patient is an 77 year old patient well known to me having previously  undergone right femoral-popliteal bypass grafting in August 2003 using 6-  mm Gore-Tex.  He also had a left renal artery angioplasty and stent  procedure performed in 2004 for severe stenosis.  He was referred today  by Dr. Suzette Battiest who noted a lesion on the tip of the right 5th toe and  the 1st and 2nd toes of the left foot.  He performed an injection and  referred him for vascular evaluation.   The patient denies rest pain but does state that both feet feel frozen.  His ambulation is limited by dyspnea on exertion and vertigo, not by leg  discomfort.  He did traumatize his right foot where the ulceration is  present and it seems to be healing he states.   CHRONIC MEDICAL PROBLEMS:  1. Type 1 diabetes mellitus.  2. Hypertension.  3. Hyperlipidemia.  4. Coronary artery disease.  Previous coronary artery bypass grafting      in 2000.  5. Arrhythmias with defibrillator and pacemaker inserted in 2002,      negative for stroke.   SOCIAL HISTORY:  Married, has 4 children, is retired, does not use  tobacco or alcohol.   FAMILY HISTORY:  Negative for coronary artery disease, diabetes or  stroke.   REVIEW OF SYSTEMS:  Positive for leg discomfort with walking, cold  sensation in his feet, arthritis, occasional chest tightness, dyspnea on  exertion.  All other systems are negative.   PHYSICAL EXAMINATION:  Vital signs:  Blood pressure 152/70, heart rate  82, respirations 20.  Generally:  He is a well-developed, well-nourished  male who is in no apparent distress, alert and oriented x3.  HEENT:  Normal for age.  EOMs intact.  Lungs:  Clear to auscultation.  No  rhonchi or wheezing.  Cardiovascular:  Regular rhythm.  No murmurs.  Carotid pulses 3+.  No audible  bruits.  Abdomen:  Obese.  No palpable  masses.  Musculoskeletal:  Free of major deformities.  Neurologic:  Reveals decreased sensation in both feet.  Skin:  Free of rashes  although there is a small healing eschar over the right 5th toe at the  tip measuring about 4 or 5 mm in diameter with no infection or  ulceration.  There is also some very subtle bruising on the tip of the  left 1st but no ulceration, erythema or ischemia.   Today I ordered lower extremity arterial scan and Dopplers.  His right  femoral-popliteal graft widely patent and unchanged from previous  studies with an ABI of 0.87, left leg is stable at 0.75 which he has  been for several years.   There is no change in his circulation and bypass is functioning nicely.  I do not think the toe lesions are related to his arterial circulation.  No further evaluation or workup is needed.  We will continue to follow  his right femoral-popliteal graft on a regular basis in the lab.     Quita Skye Hart Rochester, M.D.  Electronically Signed   JDL/MEDQ  D:  07/01/2010  T:  07/02/2010  Job:  [id number]   cc:  Lockie Mola. Lenord Fellers, M.D.

## 2010-12-23 NOTE — Progress Notes (Addendum)
CC: FU of T2DM, hypertension, peripheral neuropathy, angiopathy, hypoglycemia, goiter, depression  HPI: Dylan Jenkins is a spirited 77 y.o. white gentleman accompanied by his long-suffering and loving wife. 1. Dylan Jenkins was first referred to me in 2006 for assistance in managing his T2DM. His DM had been recognized some 5-10 years previously. As a man from "a good old [place] family" he had no real interest in adhering to a lower carb diet. As a former Copywriter, advertising, he knew how to make food taste good and he had developed a taste for good food. In addition, because of his back problems he was not able to exercise much at all. He also had significant problems with peripheral neuropathy, leg and foot angiopathy, hypertension, ASHD, ASCVD, CHF, severe chronic back pains, and peripheral edema. He gave a surgical history of coronary artery bypass graft in 2002, defibrillator placement in 2002, right leg arterio-venous graft iin 2003, and coronary stent in 2004. 2. In the interim his HbA1c values have ranged up and down from 5.9 % to 8.3 % depending upon his willingness to cooperate with eating right and his adherence to medication regimens.he failed a Byetta regimen in 2007. In the last year he has been on a regimen of Lantus insulin, 40 units at HS, Januvia, 100 mg each AM, and and glipizide, 20 mg, twice daily. In the past year his HbA1 values ranged from 7.0 % to 7.6 %.    His HbA1c today was 7.2 %. 3. His last [facility] visit was on 02.13.12. Despite being very restricted by his lower back pains and sciatica, he and his wife do attend many family events together. He [facility] Ethelene [facility] recently and was given an injection, which apparently did not work very well.  4. PROS: Constitutional: Dylan Jenkins actually feels fairly well. The patient feels well, is healthy, and has no significant complaints. Eyes: Vision is good. There are no significant eye complaints. Neck: The patient has no  complaints of anterior neck swelling, soreness, tenderness,  pressure, discomfort, or difficulty swallowing.  Heart: The patient has no complaints of chest pain or chest pressure. Gastrointestinal: Bowel movents seem normal. The patient has no complaints of excessive hunger, acid reflux, upset stomach, stomach aches or pains, diarrhea, or constipation. Legs: There are no complaints of numbness, tingling, burning, or pain. No edema is noted. Feet: He did stub his right second toe recently, leading to bleeding. The lesion is healing. His feet always feel like he is walking on a balloon. No edema is noted.  PMFSH: 1. He remains active with his family.  ROS: Dylan Jenkins is not bothered significantly by other issues involving 11 other body systems.  PHYSICAL EXAM: BP 123/65  Pulse 81  Wt 211 lb 3.2 oz (95.8 kg) Constitutional: Dylan Jenkins is obese and walks with an antalgic gait. He is surprisingly upbeat today. He seemed very upbeat today.  Eyes: There is no arcus or proptosis.  Mouth: The oropharynx appears normal. The tongue appears normal. There is normal oral moisture. There is no obvious gingivitis. Neck: There are no bruits present. The thyroid gland is enlarged in size. The thyroid gland is approximately 20-25 grams in size. The consistency of the thyroid gland is fairly firm. There is no thyroid tenderness to palpation. Lungs: The lungs are clear. Air movement is good. Heart: The heart rhythm and rate appear normal. Heart sounds S1 and S2 are normal.He has a a grade 2-3/6 systolic ejection murmur.  Abdomen: The  abdominal size is /enlarged. Bowell sounds are normal. The abdomen is soft and non-tender. There is no obviously palpable hepatomegaly, splenomegaly, or other masses.  Arms: Muscle mass appears appropriate for age. Radial pulses appear normal. Hands: There is no obvious tremor. Phalangeal and metacarpophalangeal joints appear normal. Palms are normal. Legs: Muscle mass appears  appropriate for age. There is trace pedal edema.  Feet: There is a healing abrasion on the tip of the right second toe. I could not feel the dorsalis pedis pulses bilaterally.  Neurologic: Muscle strength is somewhat low for age and gender  in both the upper and the lower extremities. Muscle tone appears normal. Sensation to touch is normal in the legs, but severely decreased in the dorsa and heels of both feet.  Labs 01.19.12  ASSESSMENT: 1. T2DM: His glucose control is very good overall and he is not having any hypoglycemia. His current medication regimen works when he does his part. This is the most cooperative he's been in the past few years. 2. Peripheral neuropathy: While he does have some anesthesia in his feet, his sensation is much better than it was several years ago. Moreover, he no longer has the painful aspect of his neuropathy. 3. Hypertension: He is doing well.  4. Angiopathy: There is no significant change, not better, but no worse. 5. Depression: He is doing surprisingly well.  PLAN:  1. Continue his current diabetic medications. 2. Congratulated him on doing a good job of watching his diet. 3. Follow-up in four months.   Level of Service: This visit lasted in excess of 40 minutes. More than 50% of the visit was devoted to counseling.  Dylan Jenkins

## 2010-12-23 NOTE — Assessment & Plan Note (Signed)
[facility]                         ELECTROPHYSIOLOGY OFFICE NOTE   CASHTON, HOSLEY                        MRN:          [id number]  DATE:06/05/2008                            DOB:          March 02, 1926    Mr. Cobbs returns today for followup.  He is a very pleasant 77-year-  old male with ischemic cardiomyopathy, ventricular tachycardia,  congestive heart failure who returns today for followup.  The patient's  main complaint today is that of fatigue and weakness.  He gets tired  when he walks.  He has not been hospitalized with decompensated heart  failure.  His symptoms are more with that of low output state.  He  denies chest pain.  He has dyspnea with exertion.   CURRENT MEDICATIONS:  1. Byetta 5 mg a day.  2. Gabapentin 300 nightly.  3. Digoxin 0.125 daily.  4. Altace 10 twice daily.  5. Wellbutrin 150 twice a day.  6. Relafen 500 a day.  7. Lipitor 40 a day.  8. Aspirin 325 a day.  9. Toprol-XL 25 a day.  10.Furosemide 40 a day.  11.Januvia 100 mg daily.  12.Lantus insulin.  13.Glipizide 10 twice a day.  14.Uroxatral 10 a day.   PHYSICAL EXAMINATION:  GENERAL:  He is a pleasant elderly-appearing man  in no acute distress.  VITAL SIGNS:  Blood pressure was 142/82, pulse was 60 and regular, the  respirations were 18, the weight was 211 pounds.  NECK:  A 7-8 cm jugular venous distention.  LUNGS:  Clear except for rales in the bases bilaterally.  There was no  increased work of breathing.  CARDIOVASCULAR:  Regular rate and rhythm.  Normal S1 and a split S2.  His PMI was enlarged and laterally displaced.  ABDOMINAL:  Soft,  nontender, nondistended.  There is no organomegaly.  The bowel sounds  are present.  There is no rebound or guarding.  EXTREMITIES:  Demonstrated no cyanosis, clubbing, or edema.  Question of  trace peripheral edema.  NEUROLOGIC:  Alert and oriented x3.  Cranial nerve is intact.  Strength  is 5/5 and symmetric.   Prior EKG demonstrates sinus rhythm with right bundle branch block and a  first-degree AV block.  The QRS duration was around 160, the PR interval  around 220.   Interrogation of his defibrillator demonstrates a Guidant prism  implanted in January 2002.  The R-waves were 11, the impedance 590, the  threshold 1.2 at 0.4, battery voltage was 2.56 (MOL2).  His underlying  rhythm was AFib.  There are no intercurrent IC therapies.   IMPRESSION:  1. Ischemic cardiomyopathy.  2. Congestive heart failure.  3. Ventricular tachycardia.   DISCUSSION:  Mr. Hamric's heart failure has worsened.  It appears to be  class III with a QRS duration of 150 milliseconds and a PR interval of  220 milliseconds.  He has evidence and indication for upgrade to a  biventricular ICD.  I have given him 2 options for this, one to wait  until his device reaches ER, which will be in several months.  Second  option  will be to proceed with upgrade to a biventricular device in the  next several weeks.  He will call us and return in a couple of weeks to  have his BiV ICD upgraded.     Doylene Canning. Ladona Ridgel, MD  Electronically Signed    GWT/MedQ  DD: 06/05/2008  DT: 06/06/2008  Job #: [id number]

## 2010-12-23 NOTE — Op Note (Signed)
NAME:  Dylan Jenkins, Dylan Jenkins NO.:  [id number]   MEDICAL RECORD NO.:  [id number]          PATIENT TYPE:  INP   LOCATION:  2031                         FACILITY:  [facility]   PHYSICIAN:  Doylene Canning. Ladona Ridgel, MD    DATE OF BIRTH:  Feb 19, 1926   DATE OF PROCEDURE:  06/19/2008  DATE OF DISCHARGE:                               OPERATIVE REPORT   PROCEDURE PERFORMED:  Removal of a previously implanted single chamber  defibrillator and insertion of a new biventricular defibrillator with  insertion of a new right atrial lead utilizing left upper extremity  venography and coronary sinus venography.  In addition, ICD pocket  revision was carried out.   INTRODUCTION:  The patient is a very pleasant 77 year old man who has a  history of an ischemic cardiomyopathy and ventricular tachycardia.  The  patient has had worsening congestive heart failure which has now become  class III.  He has bundle branch block with a QRS duration of 116  milliseconds.  He is now referred for upgrade from a single chamber  defibrillator to a biventricular device.   PROCEDURE:  After informed consent was obtained, the patient was taken  to the Diagnostic EP Lab in a fasting state.  After usual preparation  and draping, intravenous fentanyl and midazolam was given for sedation.  Lidocaine 30 mL was infiltrated into the left infraclavicular region.  A  7-cm incision was carried out over this region and electrocautery was  utilized to dissect down to the fascial plane.  After demonstrating that  the left subclavian vein was patent, the vein was punctured x2.  The  Medtronic model 5076 52-cm active fixation pacing lead, serial number  [id number] was advanced into the right atrium.  Mapping was carried out  and in the final site, the P-waves measured 2 mV, and the pacing  threshold rather was 1.2 volts at 0.5 milliseconds.  The pacing  impedance was 595 ohms.  10 volts pacing did not stimulate the  diaphragm.  With  the lead actively fixed, there was a nice injury  current noted.  With these satisfactory parameters, attention was then  turned to placement of the left ventricular lead.  The coronary sinus  guiding catheter was advanced into the right atrium.  It should be noted  that there was a fair amount of difficulty getting across the innominate  vein SVC junction as this area was narrowed and stenotic.  Utilizing the  0.035 guidewire, this was accomplished, however.  The coronary sinus was  cannulated without particular difficulty.  Venography of the coronary  sinus was then carried out.  This demonstrated a posterolateral vein as  well as a lateral vein.  The lateral vein was somewhat smaller but it  was selected initially for LV lead placement.  The floppy guidewire  which was an 0.014 angioplasty wire was advanced along with the  Medtronic model 4194 88-cm passive fixation bipolar LV pacing lead into  the lateral vein.  At the final site, the lead was about halfway from  base to apex at about 3 o'clock in the  LAO projection.  At this  location, 10 volts pacing did not stimulate the diaphragm.  Pacing from  multiple pacing configurations including bipolar as well as LV tip to LV  ring to RV ring were carried out.  This demonstrated acceptable, though  slightly elevated pacing parameters with no diaphragmatic stimulation.  At this point, the lead was liberated from its guiding catheter in the  usual manner.  Both the atrial and the LV lead was secured with figure-  of-eight silk sutures and the sewing sleeves on each were secured as  well.  At this point, the pocket was irrigated with kanamycin and at  this point, electrocautery was utilized to remove the old Guidant Prizm  2 single chamber defibrillator from the ICD pocket.  The pocket was then  revised to accommodate the larger BiV device with electrocautery.  Electrocautery was also utilized to assure hemostasis.  The pocket was  then  irrigated with kanamycin.  At this point, the Medtronic [place],  serial number [id number] was connected to the atrial and RV and LV leads  and placed back in the subcutaneous pocket.  The generator was secured  with silk suture.  Additional kanamycin was then utilized to irrigate  the pocket and the incision was closed with 2-0 Vicryl followed by layer  of 3-0 Vicryl.   Defibrillation threshold testing was then carried out in the usual  manner.  VF was induced with a T-wave shock.  Initially, a 15 joules  shock was utilized which failed to terminate VF.  A second 25 joules  shock was then delivered terminating VF and restoring sinus rhythm.  At  this point, no additional defibrillation threshold testing was carried  out and the incision was closed as previously mentioned.  Benzoin and  Steri-Strips were painted on the skin.  Pressure dressing was applied  and the patient was returned to his room in satisfactory condition.   COMPLICATIONS:  There were no immediate procedure complications.   RESULTS:  This demonstrates successful insertion of a new atrial and LV  pacing leads, successful removal of a previously implanted Guidant ICD  which was approaching ERI and insertion of a new biventricular ICD  utilizing the ICD pocket revision.      Doylene Canning. Ladona Ridgel, MD  Electronically Signed     GWT/MEDQ  D:  06/19/2008  T:  06/20/2008  Job:  [id number]

## 2010-12-23 NOTE — Discharge Summary (Signed)
NAME:  YANIV, LAGE NO.:  [id number]   MEDICAL RECORD NO.:  [id number]          PATIENT TYPE:  INP   LOCATION:  2031                         FACILITY:  [facility]   PHYSICIAN:  Doylene Canning. Ladona Ridgel, MD    DATE OF BIRTH:  1926-03-07   DATE OF ADMISSION:  06/19/2008  DATE OF DISCHARGE:  06/20/2008                               DISCHARGE SUMMARY   This patient has no known drug allergies.   Time of dictation and examination greater than 40 minutes.   FINAL DIAGNOSIS:  1. Discharging day #1, status post implant of the Medtronic CONCERTO      CRT-D system (explanted was a [employer] implanted in January      2002).   SECONDARY DIAGNOSES:  1. Ischemic cardiomyopathy at catheterization in November 2002,      ejection fraction was 30% with global hypokinesis.  2. New York Heart Association class III chronic systolic congestive      heart failure.  3. His right bundle-branch block.  4. Left heart catheterization in November 2002 for abnormal stress      test.  The left internal mammary artery to the left anterior      descending is patent.  A sequential saphenous vein graft to obtuse      marginal #1 and obtuse marginal #3 is patent.  The saphenous vein      graft to diagonal has 50% proximal stenosis.  Saphenous vein graft      to the acute marginal is patent.  Saphenous vein graft to the ramus      intermediate is patent.  Ramus intermediate is patent, however,      there is poor run off in the ramus intermediate.  The patient has      had a stress test he tells me since that time, but no      catheterization.  5. Diabetes.  6. Status post right femoral to popliteal bypass for infrainguinal      arterial occlusive disease.  7. Stent to the left renal artery for renal artery stenosis.  8. Chronic obstructive pulmonary disease.  9. Gastroesophageal reflux disease.  10.Dyslipidemia.  11.Osteoarthritis.   PROCEDURE:  On June 19, 2008, explantation of the existing  Guidant  Prizm dual-chamber cardioverter-defibrillator with implant of the  CONCERTO BiV ICD by Dr. Lewayne Bunting.  The patient has no post-  procedural complications.  Device interrogates well.  X-ray shows that  the leads are in appropriate position and are well separated.  There is  no pneumothorax.  The patient has no hematoma at the ICD pocket.   BRIEF HISTORY:  Mr. Nelles is an 77 year old male.  He has ischemic  cardiomyopathy.  He has a history of ventricular tachycardia and because  of this had an ICD implanted in January 2002.  He also has class III  congestive heart failure and has not recently been hospitalized for  decompensation.   The patient's main complaint is fatigue and weakness.  He gets tired  whenever he walks.  His symptoms are those of a low-output state.  He  denies chest pain.  He has dyspnea with exertion.   Mr. Tallarico's heart failure has worsened.  It is now class III.  His QRS  duration is 150 milliseconds.  He has evidence and indication for  upgrade to a biventricular ICD.  The risks and benefits of this  procedure have been described to the patient as well as the fact that he  might wait several months until his device reaches ERI.  The patient  wishes to proceed as soon as possible.  This will be done on the first  elective opportunity.   HOSPITAL COURSE:  The patient presents on June 19, 2008.  He  underwent explantation of his existing cardioverter-defibrillator with  implant of the Medtronic device as dictated above.  The patient has had  no post-procedural complications and discharged on postprocedure day #1.  He will go home with a new medication, which is Keflex 250 mg 1 tablet  1/2 hour before breakfast, lunch, dinner, and bedtime for the next 5  days.  In addition, he continues his other medications which are:  1. Byetta 5 mg daily.  2. Gabapentin 300 mg at bedtime.  3. Digitek 0.125 mg daily.  4. Altace 10 mg twice daily.  5. Wellbutrin  150 mg twice daily.  6. Relafen 500 mg daily.  7. Lipitor 40 mg daily at bedtime.  8. Enteric-coated aspirin 325 mg daily.  9. Januvia 100 mg daily.  10.Toprol-XL 25 mg daily.  11.Lasix 40 mg daily.  12.Uroxatral 10 mg daily.  13.Glipizide 10 mg twice daily.  14.Lantus 45 units subcu at bedtime.  15.Prilosec 20 mg daily.  16.As mentioned above, his Keflex 250 mg 4 times a day for the next 5      days.   He is asked to keep his incision dry for the next 7 days and to sponge  bathe until Tuesday, June 26, 2008.  He is to continue medications  as listed here.  He follows up with [facility] at [address], [facility] at the [facility] on Monday July 02, 2008, at 10  o'clock.  He sees Dr. Ladona Ridgel on October 02, 2008, at 9 o'clock.   LABORATORY STUDIES:  Pertinent to this admission were drawn on June 12, 2008, white cells 8.6, hemoglobin 13, hematocrit 37.7, and platelets  are 217.  Protime 11.5.  INR is 1.  Sodium 138, potassium 4.4, chloride  101, carbonate 29, and glucose 256.  BUN is 26, and creatinine 1.7.      Maple Mirza, PA      Doylene Canning. Ladona Ridgel, MD  Electronically Signed    GM/MEDQ  D:  06/20/2008  T:  06/20/2008  Job:  [id number]   cc:   Madaline Savage, M.D.  Doylene Canning. Ladona Ridgel, MD  Luanna Cole. Lenord Fellers, M.D.

## 2010-12-23 NOTE — Assessment & Plan Note (Signed)
[facility]                         ELECTROPHYSIOLOGY OFFICE NOTE   Dylan Jenkins, Dylan Jenkins                        MRN:          [id number]  DATE:05/18/2007                            DOB:          [birth year]    SUBJECTIVE:  Dylan Jenkins returns for followup today.  He is a very  pleasant 77 year old man with an ischemic cardiomyopathy and a  ventricular tachycardia.  He is status post ICD insertion.  He returns  today for followup.  He denies chest pain or shortness of breath and  overall feels well.  He continues to have PVC's but otherwise has no  complaints.  He denies any intercurrent ICD therapies.  He has very mild  peripheral edema.  He denies other heart failure symptoms.   PHYSICAL EXAMINATION:  GENERAL APPEARANCE:  He is a very pleasant, well-  appearing elderly man in no distress.  VITAL SIGNS:  Blood pressure today was 126/64, pulse was 83 and regular,  respirations were 16, weight was 226 pounds.  NECK:  Revealed no jugular venous distention.  LUNGS:  Clear bilaterally to auscultation.  No rales, rhonchi or  wheezing were present.  CARDIOVASCULAR:  Exam revealed a regular rate and rhythm with normal S1  and S2.  EXTREMITIES:  Demonstrated no cyanosis, clubbing or edema.  The pulses  were 2+ and symmetric.   MEDICATIONS:  1. Digitek 0.125 mg daily.  2. Altace 10 twice a day.  3. Wellbutrin.  4. Lipitor 40 daily.  5. Aspirin 325 mg a day.  6. Januvia 100 a day.  7. Toprol XL 25 a day.  8. Furosemide 40 a day.  9. Lantus insulin as directed.   Interrogation of his defibrillator demonstrates a Guidant Prism II.  The  R waves were 8.  The impedance 538 ohms with threshold of 1.4 at 0.4 in  the ventricle.  The battery voltage was 2.58 volts (MOL2).  There were  no intercurrent ICD therapies.   IMPRESSION:  1. Ischemic cardiomyopathy.  2. Congestive heart failure.  3. Ventricular tachycardia.  4. Right bundle branch block.   DISCUSSION:  Overall, Dylan Jenkins is stable and his defibrillator is  working normally.  His heart failure is well controlled.  He has no  anginal symptoms.  I will plan to see him back in the office in one  year.     Doylene Canning. Ladona Ridgel, MD  Electronically Signed    GWT/MedQ  DD: 05/18/2007  DT: 05/19/2007  Job #: [id number]   cc:   Madaline Savage, M.D.

## 2010-12-23 NOTE — Procedures (Signed)
BYPASS GRAFT EVALUATION   INDICATION:  Follow up from right femoral-to-popliteal bypass graft with  Gore-Tex.   HISTORY:  Diabetes:  Yes.  Cardiac:  CABG and a pacemaker.  Hypertension:  Yes.  Smoking:  Previous.  Previous Surgery:  Right femoral-to-popliteal bypass graft, Gore-Tex,  done 03/30/2002.   SINGLE LEVEL ARTERIAL EXAM                               RIGHT              LEFT  Brachial:                    164                165  Anterior tibial:             Inaudible          113  Posterior tibial:            144                123  Peroneal:  Ankle/brachial index:        0.87               0.75   PREVIOUS ABI:  Date: 04/03/2008  RIGHT:  0.84  LEFT:  0.65   LOWER EXTREMITY BYPASS GRAFT DUPLEX EXAM:   DUPLEX:  Biphasic waveforms throughout the right lower extremity bypass  graft in its native vessels.  Increased velocities within the right  distal external iliac artery at 2.45 m/s and the proximal anastomosis at  2.54 m/s.   IMPRESSION:  1. Stable ankle brachial indices.  2. Patent right femoral-popliteal bypass graft, Gore-Tex with      increased at the distal external iliac artery and the common      femoral artery/proximal anastomosis, suggestive of >50% stenosis.  3. No significant changes from the previous study.   ___________________________________________  Dylan Jenkins, M.D.   OD/MEDQ  D:  07/02/2010  T:  07/02/2010  Job:  [id number]

## 2010-12-23 NOTE — Patient Instructions (Signed)
Please continue with your current regimen of Lantus, glipizide, and Januvia.

## 2010-12-26 NOTE — Op Note (Signed)
NAME:  Dylan Jenkins, Dylan Jenkins NO.:  [id number]   MEDICAL RECORD NO.:  [id number]          PATIENT TYPE:  AMB   LOCATION:  SDS                          FACILITY:  [facility]   PHYSICIAN:  Quita Skye. Hart Rochester, M.D.  DATE OF BIRTH:  1926/02/09   DATE OF PROCEDURE:  12/03/2005  DATE OF DISCHARGE:  12/03/2005                                 OPERATIVE REPORT   PREOPERATIVE DIAGNOSIS:  Left superficial femoral occlusive disease and  bilateral tibial disease with bilateral claudication symptoms.   POSTOPERATIVE DIAGNOSIS:  Left superficial femoral occlusive disease and  bilateral tibial disease with bilateral claudication symptoms.   PROCEDURE:  Abdominal aortogram with bilateral lower extremity runoff via  right common femoral approach.   SURGEON:  Quita Skye. Hart Rochester, M.D.   ANESTHESIA:  Local Xylocaine.   CONTRAST:  165 mL.   COMPLICATIONS:  None.   DESCRIPTION OF PROCEDURE:  The patient was taken to the [facility]  Peripheral Endovascular Lab and placed in a supine position at which time  both groins were prepped with Betadine solution and draped in a routine  sterile manner.  After infiltration of 1% Xylocaine, the right common  femoral artery was entered percutaneously. Initially a Wholey wire was  passed into the suprarenal aorta under fluoroscopic guidance, a 5-French  sheath and dilator were attempted to be passed over the [facility] wire but  unsuccessful because of scar tissue.  Therefore, the wire was removed,  adequate compression applied, and one attempt was made on the left side but  I was not able to enter the artery on the left side successfully due to  severe plaque formation in the common femoral arteries.  I returned to the  right side where I was able to access the right common femoral artery and a  Rosen wire was advanced into the suprarenal aorta.  An attempt was made to  pass a 5-French sheath and dilator over the [facility] wire which was also  unsuccessful.   Therefore, an Indole catheter was inserted, Rosen wire  exchange for an Amplatz wire, and the 5-French sheath and dilator traversed  the artery without difficulty.  A pigtail catheter was positioned in the  suprarenal aorta and flush abdominal aortogram performed injecting 20 mL  contrast at 20 mL per second.  The aorta was widely patent with single renal  arteries bilaterally.  There was a stent in the proximal left renal artery  which had mild tapered narrowing approximating 30% but no significant  stenosis on the right.  The aorta had diffuse infrarenal disease but no  significant stenoses noted.  RAO and LAO projections of the iliac arteries  were obtained and there was diffuse iliac occlusive disease but no stenosis  in the common or external iliac arteries bilaterally.  The internal iliac  arteries were diffusely diseased.  The catheter was drawn into the terminal  aorta and bilateral lower extremity runoff performed injecting 88 mL  contrast at 8 mL per second.  This revealed the right common femoral and  profunda femoris artery to be patent.  There was a  right femoral to above  knee popliteal bypass with runoff through a diseased popliteal artery which  had a moderate stenosis at its very distal end approximating 50-60% and two-  vessel runoff through peroneal and posterior tibial arteries which were both  diseased.  On the left side, the common femoral and profunda femoris  arteries were patent.  The left superficial femoral artery had multiple  lesions, none of which were severe but multiple 40-50% stenoses.  The  popliteal artery was patent across the knee joint and there was two-vessel  runoff on the left via the posterior tibial which had a high origin just  below the knee joint and the peroneal artery.  Having tolerated procedure  well, the sheath was removed over the guide wire, adequate compression  applied.  No complications ensued.   FINDINGS:  1.  Widely patent left  renal artery stent.  2.  Diffuse aortoiliac occlusive disease with no significant stenoses.  3.  Patent right femoral to above-knee popliteal bypass with distal tibial      disease and two-vessel runoff.  4.  Diffuse left superficial femoral occlusive disease which is nonocclusive      and two vessel runoff through posterior tibial and peroneal artery.           ______________________________  Quita Skye Hart Rochester, M.D.     JDL/MEDQ  D:  12/03/2005  T:  12/03/2005  Job:  [id number]

## 2010-12-26 NOTE — Assessment & Plan Note (Signed)
[facility]                         ELECTROPHYSIOLOGY OFFICE NOTE   MAXXWELL, EDGETT                        MRN:          [id number]  DATE:07/27/2006                            DOB:          05/24/1926    Mr. Lalla returns today for followup. He is a very pleasant elderly  male with ischemic cardiomyopathy and ventricular tachycardia who is  status post ICD insertion back 5-1/2 years ago. He returns today for  followup after a long absence from our EP clinic. He denies chest pain.  He does have dyspnea on exertion. His heart failure is at least class  II.   PHYSICAL EXAMINATION:  GENERAL: He is a pleasant elderly man in no acute  distress. He looks somewhat younger than his stated age however.  VITAL SIGNS: His blood pressure today was 140/72, the pulse was 65 and  regular, respirations were 18.  NECK:  Revealed no jugular venous distension.  LUNGS: Clear bilaterally to auscultation, no wheezes, rales or rhonchi.  CARDIOVASCULAR EXAM: Regular rate and rhythm with normal S1 and S2.  EXTREMITIES: Demonstrated no clubbing, cyanosis or edema.   INTERROGATION OF DEFIBRILLATOR:  Demonstrates a Guidant Prisim-2 with R  waves of 9, impedence 597 ohms, a threshold 1.4 volts, 0.4 milliseconds,  battery voltage 2.60 volts. MOL-2.   IMPRESSION:  1. Ischemic cardiomyopathy.  2. Ventricular tachycardia.  3. Status post ICD insertion.   DISCUSSION:  Mr. Lucie Leather is stable. I have asked that he come back in 4  months for ICD check as he will be approaching ERI.     Doylene Canning. Ladona Ridgel, MD  Electronically Signed    GWT/MedQ  DD: 07/27/2006  DT: 07/27/2006  Job #: [id number]   cc:   Madaline Savage, M.D.

## 2010-12-26 NOTE — Consult Note (Signed)
[facility]  Patient:    Dylan Jenkins, Dylan Jenkins                    MRN: [id number] Adm. Date:  [id number] Attending:  Sharlet Salina CC:         Luanna Cole. Lenord Fellers, M.D.  Madaline Savage, M.D.   Consultation Report  HISTORY OF PRESENT ILLNESS:  Mr. Sherwin is a very nice 77 year old white male who basically presents with multiple medical problems.  He has been seen by Dr. Vonita Moss in the past for routine prostate checks and now is admitted for fatigue status post coronary artery bypass, congestive heart failure, and status post placement of a defibrillator.  Since he has been hospitalized he has developed some urgency and frequency, but noted no such symptoms at home. He has noted no blood in his urine or pain and has had decent flow of urine. He has had questionable urge incontinence but his history is quite vague in this area.  Additionally, he has had a PSA in January 2002 which was reportedly within normal limits.  He has been empirically placed on Cipro since October 01, 2000 for questionable prostatitis.  An abdominal ultrasound as noted on October 02, 2000 revealed a small renal cyst only, questionable gallstone or gallbladder polyp, and basically comes for urinary evaluation.  ALLERGIES:  No known drug allergies.  MEDICATIONS:  Micronase, Trandate, Zocor, Lasix, K-Dur, aspirin, Norvasc, Imdur, Lanoxin, Flonase, Advair, Protonix, Prozac, Cipro.  PAST MEDICAL HISTORY:  History of bone spurs removed from his heel in 1971, status post left eye cataract extraction 1994, coronary artery bypass graft February 2000, pneumovax immunization in 1994 and January 2001 respectively and did have an influenza vaccine November 2001 and tetanus immunization 1992, screening flexible sigmoidoscopy 1992, prostatitis treated by Dr. Vonita Moss in the past, defibrillator placed and coronary artery bypass as noted.  SOCIAL HISTORY:  He is married.  Has four adult  children, three sisters in good health.  Former heavy smoker two packs a day for 50 years.  No significant alcohol consumption.  FAMILY HISTORY:  Father died at age 50 of arteriosclerotic cardiovascular disease.  Mother died at age 41 of hypertension and stroke.  Arthritis. Father also apparently had a stroke.  REVIEW OF SYSTEMS:  He has mainly fatigue, malaise, shortness of breath with exertion.  No shaking chills or significant fever.  He has somewhat of a flat affect.  Has occasional dizziness with standing.  Has the urinary symptoms just recently as noted.  PHYSICAL EXAMINATION  VITAL SIGNS:  He is afebrile.  Vital signs stable.  Blood pressure 150/92, pulse 79, respirations 19, temperature 97.4 degrees Fahrenheit.  GENERAL:  He is well-nourished, well-developed, in no acute distress.  He is oriented x 3 but he does appear to be somewhat lethargic, yawns, and is sleepy.  HEENT:  PERRLA.  Ear, nose, and throat:  Clear.  NECK:  Without mass, thyromegaly, or bruits.  CHEST:  Clear anterior and posterior without rales or rhonchi.  He does have a defibrillator in place that can be palpated.  CARDIAC:  Normal sinus rhythm without murmurs or gallops.  ABDOMEN:  Soft, nontender without masses or organomegaly.  EXTREMITIES:  Essentially normal.  NEUROLOGIC:  He is somewhat lethargic, generally flat, alert, oriented.  GENITOURINARY:  Penis, meatus, scrotum, testicle, adnexa, anus, and perineum are normal.  He has no hernias.  He has no obvious hemorrhoids.  Rectal vault is empty.  There are no palpable rectal  masses.  Sphincter tone is normal.  He has a normal bulbocavernosus reflex positive.  Prostate is 30 g, symmetric, benign, and soft.  LABORATORIES:  Urinalysis on October 02, 2000 was negative.  BUN and creatinine was 6/1.3.  Urine culture and sensitivity is negative so far.  Ultrasound as above that showed essentially normal kidneys with a small  cyst only.  IMPRESSION:  I doubt his mild benign prostatic hypertrophy is contributing to the chronic fatigue and that his urinary symptoms probably relate to mild benign prostatic hypertrophy recumbency and intravenous fluids and 10 day course of Cipro is probably appropriate in that chronic prostatitis is a possibility, but very remote.  I wish I could shed further light on this issue.  PLAN:  Please have patient followup and see Dr. Vonita Moss and call me as needed. DD:  10/05/00 TD:  10/06/00 Job: [id number]

## 2010-12-26 NOTE — Cardiovascular Report (Signed)
[place]  Patient:    PAVAN, BRING Visit Number: [id number] MRN: [id number]          Service Type: CAT Location: [facility] 2899 [id number] Attending Physician:  Ophelia Shoulder Dictated by:   Madaline Savage, M.D. Proc. Date: 06/20/01 Admit Date:  06/20/2001 Discharge Date: 06/20/2001   CC:         Luanna Cole. Lenord Fellers, M.D.  Cardiac Catheterization Laboratory   Cardiac Catheterization  PROCEDURES PERFORMED: 1. Selective coronary angiography by Judkins technique. 2. Retrograde left heart catheterization. 3. Left ventricular angiography. 4. Selective visualization of multiple saphenous vein grafts. 5. Nonselective visualization of the left internal mammary artery    graft.  COMPLICATIONS: None.  ENTRY SITE: Right femoral.  DYE USED: Omnipaque.  PATIENT PROFILE: The patient is a 77 year old gentleman, who [facility] to evaluate an abnormal Cardiolite stress test recently performed. That stress test has shown inferior and lateral wall scan and reperfusion abnormalities consistent with ischemia.  The patient underwent coronary artery bypass grafting in February of 2000 by Dr. Dorris Fetch. The grafts placed include the following: There was a left internal mammary artery graft to the LAD, a sequential saphenous vein graft to obtuse marginal branches #1 and #3, a saphenous vein graft to the ramus intermediate branch of the circumflex, a saphenous vein graft was placed at the acute marginal branch of the right coronary artery, and a saphenous vein graft to diagonal branch #1.  RESULTS:  PRESSURES: The left ventricular pressure was 140/16, central aortic pressure 140/65, mean of 90.  No aortic valve gradient by pullback technique.  ANGIOGRAPHIC RESULTS: The left main coronary artery was calcified and approximately 60-70% stenosis at the ostium.  The LAD contained a high-grade stenosis approaching 80%  in the proximal portion of the vessel just beyond a takeoff of an optional diagonal branch and between first and septal perforators #1 and #2. Beyond septal perforator #5, the LAD contained competitive flow from what appears to be a patent left internal mammary artery graft. This is a supposition based on the pulsatile flow in the distal vessel. It does not come from antegrade filling. It comes from filling from above.  The circumflex shows a stump of a first obtuse marginal branch. It is calcified. It contains a 40% lesion after the first obtuse marginal branch stump, and then in the midportion of the vessel there is a 90% stenosis. The distal vessel is small and poorly visualized by antegrade filling.  The right coronary artery is 100% occluded just beyond a pulmonary conus branch.  No antegrade filling of the remainder of the vessel is seen.  The first graft visualized was a diagonal branch with very very poor filling of a distal vessel. The graft itself is diseased. There is a 50% proximal stenosis and a lumpy bumpy appearance of the remainder of the graft.  The vein graft to obtuse marginal branches #1 and #3 are widely patent with a rich supply to the distal circumflex as well.  The vein graft to the acute marginal branch of the right is patent and the runoff is fairly good. However, the acute marginal does not back-fill the RCA.   The saphenous vein graft to the intermediate ramus branch is patent. However, the intermediate ramus branch runoff is very poor.  The IMA was nonselectively filled and was seen to show good pulsatile flow and no apparent lesions.  LEFT VENTRICULAR ANGIOGRAM: The left ventricular angiogram shows  global hypokinesis of the left ventricle with an ejection fraction estimate of 30%. There is no LV thrombus noted. There is trivial mitral regurgitation.  FINAL DIAGNOSES: 1. Left ventricular dysfunction, moderate degree, ejection fraction 30% 2.  Four-vessel native coronary artery disease as described above. 3. Status post coronary artery bypass graft:    a. Patent left internal mammary artery graft to distal left anterior       descending.    b. Patent saphenous vein graft to acute marginal branch of the       right coronary artery.    c. Patent sequential vein graft to obtuse marginal #1 and obtuse marginal       #3.    d. Patent saphenous vein graft to intermediate ramus branch.    e. Patent vein graft to diagonal branch.  RECOMMENDATIONS: Continued medical therapy.  DISCUSSION: The patients abnormal Cardiolite stress test result showing scar as well as superimposed ischemia in the inferolateral wall is based on the finding if the patient has a right coronary artery that is occluded proximally and there is no collateral flow either in an antegrade fashion or in a retrograde fashion from the native circulation or from the existing grafts.  Left ventricular dysfunction remains unchanged from his previous catheterization of January of this year. Dictated by:   Madaline Savage, M.D. Attending Physician:  Ophelia Shoulder DD:  06/20/01 TD:  06/21/01 Job: [id number]

## 2010-12-26 NOTE — H&P (Signed)
NAME:  Dylan Jenkins, Dylan Jenkins                           ACCOUNT NO.:  [id number]   MEDICAL RECORD NO.:  [id number]                   PATIENT TYPE:  INP   LOCATION:  NA                                   FACILITY:  [facility]   PHYSICIAN:  Quita Skye. Hart Rochester, M.D.               DATE OF BIRTH:  1925-12-23   DATE OF ADMISSION:  03/29/2002  DATE OF DISCHARGE:                                HISTORY & PHYSICAL   PRIMARY CARE PHYSICIAN:  Luanna Cole. Lenord Fellers, M.D.   CARDIOLOGY:  Madaline Savage, M.D.   PULMONOLOGY:  Oley Balm. Sung Amabile, M.D. [facility] and Dr. Jayme Cloud.   CHIEF COMPLAINT:  Claudication.   HISTORY OF PRESENT ILLNESS:  The patient is a 77 year old white male with a  long history of claudication in his lower extremities, right leg worse than  left.  His symptoms come after walking about 1/2 block.  He denies rest pain  or lacerations.  He is referred to see the TS as a new patient.  He saw Dr.  Hart Rochester on March 07, 2002.  At that time he was noted to have a right ABI of  0.65 and a left ABI of 0.91.  Dr. Hart Rochester recommended proceeding with  arteriogram.  Today, March 23, 2002, he underwent abdominal aortogram with  bilateral lower extremity run off and this showed severe right superficial  femoral artery occlusive disease as well as diffuse left superficial femoral  artery disease.  Dr. Hart Rochester has recommended proceeding with a right lower  extremity femoral to popliteal bypass graft.  This is discussed with the  patient, the risks, benefits, details and alternatives and agreed to  proceed.  It is scheduled for Wednesday, March 29, 2002.   PAST MEDICAL HISTORY:  1. Peripheral vascular disease.  2. Coronary artery disease.  3. Congestive heart failure and ischemic cardiomyopathy with an ejection     fraction of 25%.  4. Paroxysmal ventricular tachycardia with ICD implantation.  5. Chronic obstructive pulmonary disease.  6. Type 2 noninsulin dependent diabetes mellitus.  7. Renal artery stenosis with  90% left renal artery stenosis.  8. Hyperlipidemia.  9. Gastroesophageal reflux disease.  10.      Osteoarthritis.  11.      Sinusitis.   PAST SURGICAL HISTORY:  1. Coronary artery bypass graft times 6 on  May 25, 1999, by Dr.     Dorris Fetch.  2. ICD implantation August 10, 2000.  3. Cataract surgery OU in 1994.  4. Bone spur surgery in 1971.   SOCIAL HISTORY:  He has been married for 52 years.  He previously smoked 2  packs per day for 30 years, but he quit 10 years ago.  He has an occasional  beer.  He is retired.  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   FAMILY HISTORY:  His father is deceased at age 40  from coronary artery  disease and diabetes.  His mother lived to be around 26 and she had  peripheral vascular disease.  He has a brother who is alive and well and 3  sisters who are alive and well.  He has 5 children who live in the area.   MEDICATIONS:  1. Toprol XL 50 mg p.o. q. a.m.  2. Protonix 40 mg p.o. q.d.  3. Digitek 125 mcg daily.  4. Glucotrol 5 mg daily.  5. Wellbutrin SR 150 mg p.o. b.i.d.  6. Altace 10 mg p.o. b.i.d.  7. Zocor 40 mg p.o. q. h.s.  8. Enteric coated aspirin 325 mg p.o. q.d.  9. Centrum multivitamin one p.o. q.d.   ALLERGIES:  No known drug allergies.   REVIEW OF SYSTEMS:  HEENT:  He denies visual changes.  He denies problems  with speech or swallowing.  PULMONARY:  He denies   Dictation ended at this point.       Jody P. Diamond Nickel.                       Quita Skye Hart Rochester, M.D.    JPL/MEDQ  D:  03/23/2002  T:  03/27/2002  Job:  [id number]

## 2010-12-26 NOTE — Discharge Summary (Signed)
NAME:  Dylan Jenkins, Dylan Jenkins                           ACCOUNT NO.:  [id number]   MEDICAL RECORD NO.:  [id number]                   PATIENT TYPE:  INP   LOCATION:  2025                                 FACILITY:  [facility]   PHYSICIAN:  Quita Skye. Hart Rochester, M.D.               DATE OF BIRTH:  [birth year]   DATE OF ADMISSION:  03/30/2002  DATE OF DISCHARGE:  04/03/2002                                 DISCHARGE SUMMARY   PRIMARY ADMISSION DIAGNOSES:  1. Peripheral vascular disease with claudication.  2. Severe right superficial femoral, popliteal and tibial occlusive disease.   ADDITIONAL DIAGNOSES:  1. History of coronary artery disease, status post coronary artery bypass     graft in February 2000.  2. History of congestive heart failure.  3. History of ventricular tachycardia, status post implantable cardioverter     defibrillator placement January 2002.  4. Chronic obstructive pulmonary disease.  5. Type 2 diabetes mellitus.  6. Left renal artery stenosis.  7. Hyperlipidemia.  8. Gastroesophageal reflux disease.  9. Osteoarthritis.  10.      History of sinusitis.   PROCEDURES:  1. Right femoral to above knee popliteal bypass with 6 mm Gore-Tex graft.  2. Intraoperative arteriogram.   HISTORY OF PRESENT ILLNESS:  The patient is a 77 year old male with a long  history of claudication.  He states that he can only walk approximately a  half a block before he develops pain, right worse than left.  He denies any  history of rest pain or nonhealing ulcers.  He was initially seen by his  primary care physician, Dr. Lenord Fellers, who referred him to Dr. Hart Rochester for  further work-up.  He ultimately underwent an arteriogram which showed right  SFA, popliteal, and tibial occlusive disease.  It was recommended that he  proceed with surgical revascularization at this time.   HOSPITAL COURSE:  He was admitted on March 30, 2002, and taken to the  operating room where he underwent right femoral to above knee  popliteal  bypass with 6 mm Gore-Tex graft as conduit.  He tolerated the procedure  well.  He was transferred to the floor in stable condition.   Postoperatively, he has done well.  He has maintained strong Doppler signals  in all three pedal vessels postoperatively.  His incisions are all healing  well.  He has been ambulating in the halls and is making marked improvement  in this area.  He has remained afebrile and his vital signs have been  stable.  His postoperative ABIs show 0.95 on the right and 0.79 on the left.  He is tolerating a regular diet and is having normal bowel and bladder  function.  He has been afebrile and all vital signs have been stable.  It is  felt that if he continues to do well, he will be ready for discharge home on  April 03, 2002.  DISCHARGE MEDICATIONS:  1. Tylox one to two q.4h. p.r.n. for pain.  2. Toprol XL 50 mg q.d.  3. Protonix 40 mg q.d.  4. Digitek 125 mcg q.d.  5. Glucotrol 5 mg q.d.  6. Wellbutrin SR 150 mg b.i.d.  7. Altace 10 mg b.i.d.  8. Zocor 40 mg q.h.s.  9. Enteric coated aspirin 325 mg q.d.  10.      Centrum multivitamin q.d.   DISCHARGE INSTRUCTIONS:  He is to refrain from driving, heavy lifting or  strenuous activity.  He may continue daily walking, use of incentive  spirometry.  He is asked to shower daily and clean his incisions with soap  and water.  He is to continue his same preoperative diet.   FOLLOW UP:  He will see the nurse in the CV[facility] office in one week for staple  removal.  He will then follow up with Dr. Hart Rochester in three weeks with repeat  ABIs at that time.     [name], P.A.                     Quita Skye Hart Rochester, M.D.    GLC/MEDQ  D:  04/02/2002  T:  04/04/2002  Job:  [id number]   cc:   Luanna Cole. Lenord Fellers, M.D.   Madaline Savage, M.D.   Danice Goltz, M.D. [facility]  [address] [place], [place] [address]  Fax: 1

## 2010-12-26 NOTE — Discharge Summary (Signed)
[facility]  Patient:    Dylan Jenkins, Dylan Jenkins                    MRN: [id number] Adm. Date:  [id number] Disc. Date: 10/06/00 Attending:  Sharlet Salina CC:         Madaline Savage, M.D.  Maretta Bees. Vonita Moss, M.D.  Doylene Canning. Ladona Ridgel, M.D. [facility]  Danice Goltz, M.D.  [facility], Dr. Kathlene Cote, [place], [place]   Discharge Summary  FINAL DIAGNOSES:  1. Congestive cardiomyopathy.  2. Fatigue and malaise, etiology unclear.  3. Sinusitis.  4. Benign prostatic hypertrophy.  5. Hypertension.  6. Adult onset diabetes mellitus.  7. Hyperlipidemia.  8. Gastroesophageal reflux disease.  9. Urge urinary incontinence. 10. Coronary artery disease.  BRIEF HISTORY:  This 76 year old male was recently admitted to [facility] August 30, 2000 in acute congestive heart failure after presenting to my office markedly short of breath with JVD and rales in his chest. While hospitalized January 21st through January 29th, he was found to have a wide QRS complex and arrhythmia. He had a VP study by Dr. Lewayne Bunting and was found to inducible ventricular tachycardia and inducible ventricular fibrillation. He received an internal defibrillator implant on January 28th and was discharged home 24 hours later.  The patient is status post coronary artery bypass grafting x 6 in February 2000. He has done reasonably well for the last couple of years but has a low ejection fraction of approximately 25%. He had a cardiac catheterization by Dr. Pearletha Furl. Alanda Amass October 03, 2000 and it was noted that all of his grafts were patent. His ejection fraction was estimated to be approximately 25%. There was severe left ventricular dysfunction and inferior hypo and akinesis.  Since being discharged from the hospital September 07, 2000, the patient has done extremely poorly. He has lost a total 27 pounds since early January when he was in my office for physical examination.  At that time his lab work was reasonably good. His diabetes was under good control and his PSA was normal. He felt pretty well at the time and was going to cardiac rehabilitation approximately three days a week. However since January 29th, he has been at home, basically bed ridden, getting up a little bit to go from bed to chair and feeling exhausted. He frequently is short of breath. He frequently falls back into bed and hyperventilates. He complains of nausea particularly when riding in a car. He was seen in the emergency department approximately ten days/two weeks ago and his lab work was normal. It was felt that he probably was somewhat depressed. He recently had been started on Prozac and was advised to continue with that. However, he was seen in the office on August 31, 2000 and was found to have marked orthostasis. He was felt to be volume depleted. It was noted that he had very little p.o. intake, both liquids and solid food since coming home from the hospital. He does have adult onset diabetes mellitus and is on Glipizide. His Accu-Cheks have been on the low side, generally 100-120, or even less at times. His wife is very concerned about him and does not know what to do. When he left the hospital on January 29th he was told that he had a sinusitis and was started on a 12 day course of Augmentin. He was not aware that he had sinusitis. He also has a history -- longstanding of gastroesophageal reflux and was  discharged home on Protonix. He also was on a high dose of Trandate 300 mg p.o. b.i.d. He has been started on Altace during his hospitalization. He is also on Norvasc 5 mg daily. He was also seen in consultation by pulmonary specialist, Dr. Corliss Skains. She had started him on an Advair inhaler and a Flonase inhaler. She subsequently saw him in her office September 15, 2000 for follow-up of pansinusitis, vocal cord dysfunction and an exacerbation of asthmatic bronchitis. She  noted he looked quite fatigued at the time. She noted he had an oxygen saturation of 98% on room air. She noted his color was somewhat ashen. He was not in acute distress and had no JVD. He had no pseudowheezes. He was tachycardic with a rate of 108. He had trace to 1+ edema. She was concerned that he had decompensation of congestive cardiomyopathy and called Dr. Elsie Lincoln, his cardiologist, who saw him the following day. At that point some of his medication was discontinued including Lasix and Norvasc. Trandate was decreased to 100 mg p.o. b.i.d. because it was felt that it could be contributing to his fatigue. However, the patient has continued to do poorly and his family is not at their wits end because he simply will not eat or drink very much of anything and complains bitterly of excessive fatigue. Because he was orthostatic and the feeling that he had volume depletion due to poor p.o. intake, he was admitted for gentle IV fluid rehydration and further evaluation.  PAST MEDICAL HISTORY:  Please see previously dictated history and physical examination from August 31, 2000 which is quite thorough.  HOSPITAL COURSE:  The patient was admitted to [facility] telemetry floor, 3-West and was given IV fluids at 50 cc an hour initially. The nursing graphic sheet indicated that he may have not urinated very much his first 24 hours of admission so subsequently, IV fluids were increased to 100 cc per hour. Nursing staff was contacted about failure to keep strict ins and outs as ordered. Subsequently the following day, it looked like he was putting out urine pretty well.  The patient seemed to have good days and bad days in the hospital. He seemed to be in his best spirits when his family was not with him. When his wife was  with him, he seemed extremely dependent and actually spoke in a softer voice and wanted to get back into bed quickly. He really was very picky about the hospital  food and had to be brought food from outside the hospital. At one point he actually ate a sub sandwich from outside the hospital containing onions an then complained bitterly of heartburn despite being on Nexium. He had an upper GI while he was here in the hospital showing reflux with a large hiatal hernia up to the distal 1/3 of the esophagus. He also had an ultrasound of his gallbladder, liver and pancreas showing some nonspecific entity in his gallbladder, which previously has been determined as a polyp, not a gallstone many years ago. He certainly has no symptoms of cholelithiasis. Also, the kidney ultrasound was within normal limits. He has carotid Dopplers that showed no ICA stenosis. We did a CT of his chest and he had no evidence of lung tumor or pleural effusions. A CT of his sinuses showed persistent maxillary and ethmoid sinusitis.  He was seen in consultation by [facility], Dr.s Inda Merlin and [place]. Dr. Elsie Lincoln is his regular cardiologist. Dr. Elsie Lincoln called me on February  26th saying that he had reviewed the chart and examined the patient and felt that his cardiomyopathy was well compensated at the present time. He had done a 2D echocardiogram and noted that based on that his ejection fraction was approximately 30% plus or minus 5%. This is really not much different than his initial ejection fraction some two years ago before bypass surgery, so he felt that the patient had done reasonably well and this did not explain all the patients excessive fatigue and malaise. He was wondering if sinusitis could do that and I told him that I did not believe that to be the case.  I also did a CEA while he was in the hospital which was normal. His TSH was normal and his ANA was negative. I was going to do an MRI of his brain to see if he had significant atrophy or even a remote CVA or an acute CVA, but since he has had the internal defibrillator placed we  cannot do that. I entertained the idea of doing a CT scan of his brain without contrast, but decided to hold off.  At times, the patient seemed to be a bit confused about events of the day. At one point he asked me if I was there the previous evening. He did not seem to remember my visit, but it was late at night. This is unusual for the patient. At other times he was his usual witty self, joking and having a good time with hospital staff. At other times, he was somewhat belligerent and did not want to cooperative with physical therapy, eat hospital food or even have his vital signs taken. We felt that he was somewhat depressed. 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 His wife has begged him for years to slow down but he really has never been to do that. He is really a Type A personality. It seems now that he is not willing to maintain that lifestyle and I wonder if he is extremely depressed about that. He also seems to be a bit anxious without being away from a hospital setting and was asking to go to cardiac rehabilitation upon discharge, even though we realized that he could not even exercise to any extent if he went. It seemed that he was comfortable around medical personnel and that he was a little afraid to go home.  His family remained quite concerned about his affect and lack of progress. They suggested that he be seen at [facility] for further evaluation. 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 He was agreeable to that and we have arranged for him to be seen as an outpatient in the heart failure clinic with Dr. Kathlene Cote on Friday, October 08, 2000. I had conversations with several doctors at [facility] trying to establish which clinic would be the most appropriate for the patient and we settled on the heart failure clinic. His records have been faxed to [facility]. His family was informed of these arrangements and were agreeable. He is being discharged on Wednesday, October 05, 2000 in stable condition. He is afebrile and his blood pressure is under good control. On the day of admission he said he felt slight fatigued, but had been up walking around in the hospital halls with his wife. When he got back in the bed, he went to lie down flat and began to sigh and  hyperventilate somewhat as if he was exhausted. However, his lungs are clear and he has had no significant arrhythmias during his hospitalization with the exception of a 12-beat run of SVT while he was asleep.  It seems that we have ruled a number of serious entities that could potentially be effecting this patient. He has been maintained no Prozac 20 mg daily and he is now being discharged home to await evaluation at [facility].  DISCHARGE MEDICATIONS:  1. Augmentin 500 mg p.o. b.i.d. for 3 weeks for sinusitis and also possible     for urinary tract infection. His urine culture eventually grew 30,000     cl/mL Enterococcus which was resistant to quinolones.  2. Advair inhaler 250/50 one puff p.o. q.12h.  3. Norvasc 5 mg p.o.q.d.  4. Flonase nasal spray 2 sprays each nostril q.d.  5. Nexium 40 mg p.o.q.d. replacing Protonix.  6. Lasix 20 mg p.o.q.d.  7. K-Dur 10 mEq 2 tablets p.o.q.d.  8. Trandate labetalol) 100 mg p.o. b.i.d.  9. Zocor 40 mg p.o.q.d. 10. Lanoxin 0.125 mg p.o.q.d. (Digoxin level is within normal limits). 11. Aspirin 325 mg enteric coated one p.o.q.d. 12. Prozac 20 mg p.o.q.d. 13. Imdur 30 mg p.o.q.d. 14. Micronase 2.5 mg p.o. q a.m. and q p.m. before breakfast and supper. 15. Altace 1.25 mg p.o. b.i.d. 16. Phenergan 25 mg tablets q.24 hours p.r.n. nausea.  DISCHARGE INSTRUCTIONS:  The patient was instructed to engage in light activity about the house with no heavy lifting and no heavy housework. He is to check his Accu-Cheks at least twice a  day before breakfast and supper. He was advised to follow-up with Dr. Elsie Lincoln in four weeks and myself in six  weeks.  He was seen urology consultant, Dr. Darvin Neighbours. His regular urologist is Dr. Larey Dresser. Dr. Earlene Plater felt the patient had benign prostatic hypertrophy but his prostate exam was entirely benign to his exam and he felt that the urge incontinence could be related to the patients fatigue and having to take some extra time ambulating and also with IV fluids, however, the patient was not getting a lot of IV fluids at the time of this consultation and sometimes would actually urinate on the floor with definite incontinence. Urine culture did grow 30,000 cl/mL Enterococcus species which may or may not be significant in a diabetic but since it was resistant to quinolones the Augmentin will cover that organism. The Augmentin will also cover his sinusitis. The patient was advised to sit in a chair for 1 hour three times a day to try to get his strength back.  He was advised to try to ambulate about the home as much as he could. We talked at length with him about the fact that he had been in bed since January 21st for the most part and of course, with he having some element of deconditioning, that it would be somewhat difficult to regain his strength at this point and that is probably why he is having some trouble with that. We were encouraging throughout his whole hospitalization. We did entertain the idea of getting a psychiatric consultant but there were some problems locating one during this hospitalization. It was not felt that the patient needed to do that acutely. He certainly was not suicidal, so we had continued his Prozac for the present time, however, we are going to continue him on Prozac.  His Accu-Cheks have been extremely stable while he has been in the hospital but he has  not a lot to eat and we certainly did not have him on any significant restrictions in the  hospital regarding his diet. We were simply happy for him to eat whatever he felt like he wanted with the exception of a sub sandwich containing onions that gave him heartburn. Also, his glucose control was excellent probably because of his decreased p.o. intake and his glycohemoglobin is quite good at 6.5. DD:  10/06/00 TD:  10/06/00 Job: [id number]

## 2010-12-26 NOTE — H&P (Signed)
NAME:  Dylan Jenkins, Dylan Jenkins                           ACCOUNT NO.:  [id number]   MEDICAL RECORD NO.:  [id number]                   PATIENT TYPE:  INP   LOCATION:  NA                                   FACILITY:  [facility]   PHYSICIAN:  Jody P. Melvyn Neth, P.A.                 DATE OF BIRTH:  06-25-1926   DATE OF ADMISSION:  DATE OF DISCHARGE:                                HISTORY & PHYSICAL   CONTINUATION:   REVIEW OF SYSTEMS:  PULMONARY:  He has dyspnea on exertion, this is nothing  new.  He denies cough or unusual shortness of breath.  GENERAL:  He denies  fever, chills or weight loss.  CARDIOVASCULAR:  He denies chest pains or  palpitations.  He denies his ICD firing.  He said he saw Dr. Elsie Lincoln one  month ago and had a good check up.  GENITOURINARY:  He has frequency from  his diabetes, but denies dysuria.  GASTROINTESTINAL:  He denies problems  with bowel movements.  NEURO:  He denies history of transient ischemic  attack or cerebrovascular accident.   PHYSICAL EXAMINATION:  VITAL SIGNS:  Temperature 96.9, pulse 62, respirations 20, blood pressure  140/70, height 67 inches, weight 220 pounds.   HEENT:  Normocephalic, atraumatic.  Pupils are equal, round and reactive to  light, visual acuity is intact.  Extraocular movements are intact OU.  He  has dentures.  Oropharynx is otherwise grossly normal.   NECK:  Supple with 3+ carotids.  There are no bruits.  There is no  adenopathy, no thyromegaly.   CHEST:  Symmetrical, breath sounds are clear and equal anterior and  posterior.  He has a midline sternotomy with a large keloid.   CARDIOVASCULAR:  Regular rate and rhythm, S1 and S2.  No murmur, rub or  gallop.  He does have some mild lower extremity edema.  He also has a  venectomy scar from his coronary artery bypass graft in his right lower  extremity.   PULSES:  Upper extremity pulses are 3+ bilaterally.  Lower extremity he has  2+ femoral pulses.  There is an occlusive dressing over the  right femoral  area.  No pedal pulses are felt.  He does however have crisp Doppler flow in  the posterior tibial areas bilaterally.  Feet are warm.  There are no  lesions.   ABDOMEN:  Soft, obese, nontender, nondistended, bowel sounds present.  There  are no masses felt.  No bruits heard.   NEUROLOGIC:  Cranial nerves II-XII are grossly intact.  There are no  deficits.   ASSESSMENT/PLAN:  This is a 77 year old white male with severe peripheral  vascular disease and claudication.  Arteriogram today showed severe right  superficial femoral artery occlusive disease.  Dr. Hart Rochester has recommended  right lower extremity femoral to popliteal bypass graft.  This is scheduled  for Wednesday, March 29, 2002.  The patient will undergo routine  preoperative studies.  He has had no unusual symptoms with regard to his  cardiac or pulmonary status and appears stable for surgery.                                               Jody P. Diamond Nickel.    JPL/MEDQ  D:  03/23/2002  T:  03/27/2002  Job:  [id number]

## 2010-12-26 NOTE — Discharge Summary (Signed)
[facility]  Patient:    Dylan Jenkins, Dylan Jenkins                    MRN: [id number] Adm. Date:  [id number] Disc. Date: [id number] Attending:  Sharlet Salina Dictator:   Darcella Gasman. Annie Paras, F.N.P.C. CC:         Luanna Cole. Lenord Fellers, M.D.  Doylene Canning. Ladona Ridgel, M.D. [facility]  Danice Goltz, M.D.   Discharge Summary  DISCHARGE DIAGNOSES:  1. Congestive heart failure, resolved.  2. Ischemic cardiomyopathy with ejection fraction 25%.  3. Ventricular tachycardia, nonsustained and inducible.     a. Electrophysiologic study.     b. Implantation of automatic implantable cardioverter/defibrillator.  4. Tracheobronchitis with fever.  5. Pansinusitis.  6. Hypertension.  7. Peripheral vascular disease with 90% left renal artery stenosis.  8. Coronary artery disease with history of coronary artery bypass graft and     patent graft.  9. Renal insufficiency. 10. Gastroesophageal reflux disease. 11. Vocal cord dysfunction with upper respiratory wheeze. 12. Non-insulin-dependent diabetes mellitus type 2. 13. Hypotension, resolved. 14. Chronic obstructive pulmonary disease. 15. Hyperlipidemia.  CONDITION ON DISCHARGE:  Improved.  PROCEDURES: 1. On September 02, 2000, combined left heart catheterization with graft    visualization and right heart catheterization by Dr. Susa Griffins. 2. On September 06, 2000, electrophysiologic study by Dr. Lewayne Bunting. 3. On September 06, 2000, implantation of automatic implantable    cardioverter/defibrillator by Dr. Lewayne Bunting.  DISCHARGE MEDICATIONS:  1. Advair 250/50 one puff twice a day.  2. Lasix 20 mg a day.  3. K-Dur 10 mEq daily.  4. Norvasc 5 mg a day.  5. Flonase one spray twice a day.  6. Saline nasal spray as needed.  7. Augmentin 875 mg twice a day x 12 days.  8. Protonix 40 mg twice a day.  9. Micronase 5 mg a day. 10. Zocor 40 mg at bedtime. 11. Enteric coated aspirin 325 mg daily. 12. Trandate 300 mg twice a  day. 13. Imdur 30 mg a day. 14. Lanoxin 0.125 mg a day.  ACTIVITY:  No strenuous activity with no driving for two weeks until seen by Dr. Ladona Ridgel.  DIET:  Low fat, low salt, low cholesterol, diabetic diet.  WOUND CARE:  Keep wound dry for seven days.  Do not raise left arm over head for seven days.  Use sling.  FOLLOWUP:  See Nada Boozer, F.N.P.C. on September 24, 2000, at 8:45 a.m.  See Dr. Ladona Ridgel in two weeks.  Please call to make that appointment.  See Dr. Jayme Cloud for followup in pulmonary.  HISTORY OF PRESENT ILLNESS:  This is a 77 year old, white, married male of Dr. Luanna Cole. [place] who was referred to [place] by Dr. Lenord Fellers on August 30, 2000, after she examined Dylan Jenkins in her office for shortness of breath and feeling bad complaining of cold symptoms.  She found him to be in congestive heart failure and sent him to [facility] Emergency Room.  The patient has a history of coronary artery bypass graft in February 2000, and decreased LV function.  EF was approximately 30%.  Over the two to three days prior to this admission, he had increased shortness of breath and was worse on the day of admission of August 30, 2000.  He denied chest pain.  He did have a fever of 101.3 on the day prior to admission and chills.  He had productive cough with yellow sputum.  When he was seen  by Dr. Lenord Fellers in her office, he was given 40 mg of IV Lasix prior to being sent to the emergency room.  In the emergency room, he was short of breath.  Blood pressure was elevated at 150/116.  His heart rate was up.  Lungs with rales and wheezes.  EKG with right bundle branch block and posterior fascicular block.  Nitroglycerin was started IV.  He was voiding and his respirations were actually improved.  We added nebulizers to his medical regimen with even more improvement.  Prior to his coronary artery bypass grafting, he had similar presentation, except he did experience chest pain at that time.  He had last  been seen in the office in October 2001, by Dr. Elsie Lincoln.  He had experienced chest pain.  An echocardiogram had been done, but his Persantine Cardiolite had not been completed.  The patient stated that he had forgot about it.  PAST MEDICAL HISTORY: 1. Cardiac bypass grafting on September 24, 1998, with LIMA to the LAD,    sequential saphenous vein graft to the OM-1 and OM-3, saphenous vein graft    to the ramus, saphenous vein graft to diagonal-1, saphenous vein graft to    acute marginal. 2. Catheterization in May 2000, grafts were patent to their targets, EF 25%. 3. Type 2 diabetes mellitus. 4. Hypertension. 5. Peripheral vascular disease. 6. Hyperlipidemia. 7. Osteoarthritis. 8. Gastroesophageal reflux disease.  PAST SURGICAL HISTORY: 1. Bone spurs of the heel in 1971. 2. Cataract surgery in 1994.  ALLERGIES:  No known drug allergies.  MEDICATIONS: 1. Micronase 5 mg daily. 2. Trandate 200 mg b.i.d. 3. Relafen 500 mg two daily. 4. Zocor 40 q.h.s. 5. Lasix 40 mg daily. 6. K-Dur 20 mEq daily. 7. Aspirin 325 mg daily. 8. Altace 5 mg daily.  Family history, social history and review of systems, please see H&P.  DISCHARGE PHYSICAL EXAMINATION:  VITAL SIGNS:  Blood pressure 150/84, pulse 72, respiratory rate 20, temperature 98.4.  HEART:  S1, S2, regular rate and rhythm, 2/6 systolic murmur.  LUNGS:  Wheezes, but no rales.  ABDOMEN:  Soft, positive bowel sounds.  EXTREMITIES:  No lower extremity edema.  CHEST:  Left anterior chest incision without drainage.  LABORATORY DATA AND X-RAY FINDINGS:  Admission labs with hemoglobin 14, hematocrit 41, WBC 8.8, MCV 86.8, platelets 214, neutrophils 83, lymphs 8, eos 2, baso 1, leukocytes.  Hemoglobin got as low as 12.3.  Prior to discharge, it  was 13 with a hematocrit of 37.6.  WBC was never elevated and actually got to a low of 3.5.  Platelets dropped to 145, but prior to discharge were 214. Prior to discharge, neutrophils  had come down to 67 and lymphs up to 18.  Admission coagulations with PT 13.4, INR 1.1, PTT 28 on heparin and was therapeutic.  Chemistries on admission with sodium 133, potassium 3.8, chloride 99, CO2 25, glucose 155, BUN 19, creatinine 1.7, calcium 8.7, total protein 7.6, albumin 3.6, AST 19, ALT 21, Alk phos 64, total bilirubin 0.7, magnesium 1.8.  Creatinine peaked at 2.2 on September 01, 2000.  With IV Lasix, it came down to 1.4 prior to discharge.  BUN peaked at 32 and was 24 prior to discharge.  Cardiac enzymes with CK ranging from 84, 107 and 268; MB 1.0, 1.0 and 0.8 all negative for MI; troponin was 0.01 to 0.02 again negative.  Cholesterol was 207, triglycerides 77, HDL 47 and LDL 145.  TSH 0.507.  Urinalysis with trace hemoglobin with wbcs 0-5,  rbcs 0-5.  Blood cultures with no growth for five days x 2.  Sputum 1+ and Gram stain was not done.  Urine culture with Staphylococcus species greater than 100,000.  Respiratory culture normal oropharyngeal flora, gram smear with abundant WBCs, moderate squamous epithelial cells, few gram-positive cocci, rare gram-negative rods, rare gram-positive rods.  Chest x-ray on August 30, 2000, with cardiac silhouette large compared to Dec 19, 1998, remains the same.  Post CABG changes are noted.  Lungs are clear without normal vascularity.  Follow up on January 22, with cardiomegaly and prior CABG, no current congestive heart failure or active disease.  On January 25, cardiomegaly without acute pulmonary process.  On January 26, CT of the sinuses with acute on chronic pansinusitis.  On September 07, 2000, chest x-ray showed good appearance following AICD implantation.  There was no pneumothorax.  Lungs were well-aerated, no edema or effusions.  EKG on admission with sinus rhythm, left atrial enlargement, right bundle branch block with bifascicular block.  On January 22, right bundle branch block continues, otherwise no changes.  On January 23,  again no changes.  On January 28, there were still no changes with old inferior MI.  Combined left heart catheterization on September 02, 2000, by Dr. Susa Griffins, grafts found to be patent, EF 25% and his left renal artery had a 90% stenosis.  His capillary wedge pressure was 26/12 with a wedge of 22. Cardiac output was 3.9, index 3.8.  Please see cardiac catheterization report for complete details.  The patient underwent EP study by Dr. Ladona Ridgel on January 28, found to have inducible ventricular tachycardia and then had AICD implantation done by Dr. Ladona Ridgel.  HOSPITAL COURSE:  Dylan Jenkins was admitted on August 30, 2000, after being seen in Dr. Jeraldine Loots office for flu-like symptoms and found to be in congestive heart failure.  He was given Lasix in her office, transferred to [facility] and given IV Lasix around the clock.  Serial CK-MBs were done with IV nitroglycerin started.  IV heparin was also started.  Blood cultures were done for temperatures greater than 100.  He came in with a fever of 99.5.  He was admitted to a telemetry unit.  By January 22, peak fever was 103 and he continued not to feel well.  His lungs had improved, but continued with some wheezes and rhonchi.  It was felt it was most likely a viral infection and blood cultures had been negative.  The patient continued to feel poorly.  He was continued on IV antibiotics.  By the evening of January 22, blood pressure dropped to the 80s.  It was felt that he was somewhat dehydrated and with standing, could have presyncope.  We gave him IV fluids which improved his blood pressure and by the next day, he was feeling somewhat better.  By January 24, he developed nonsustained ventricular tachycardia and he had some renal insufficiency.  On September 02, 2000, he underwent cardiac catheterization to evaluate ischemia in the face of ventricular tachycardia.  His grafts were patent.  He was found to have renal artery  stenosis.  The patient continued feeling weak and EP consult was obtained with pulmonary consult.  He was diagnosed with acute tracheobronchitis and pansinusitis with CT.  He also had wheezing.  AVOID ACE INHIBITORS PER PULMONOLOGY.  He also had a history of GERD and Protonix was started twice a day.  The patient continued to slowly improve.  EP study was completed on January 28,  and addition of AICD was added.  By January 29, the patient was feeling somewhat better and was ready for discharge.  His diabetes had been stable in the hospital.  His defibrillator was interrogated prior to discharge.  He would follow up with Dr. Lenord Fellers, Dr. Jayme Cloud, Dr. Ladona Ridgel as well as Dr. Elsie Lincoln as an outpatient. He was discharged home by Dr. Allyson Sabal. DD:  10/10/00 TD:  10/11/00 Job: [id number]

## 2010-12-26 NOTE — Op Note (Signed)
NAME:  COHL, BEHRENS                           ACCOUNT NO.:  [id number]   MEDICAL RECORD NO.:  [id number]                   PATIENT TYPE:  INP   LOCATION:  2025                                 FACILITY:  [facility]   PHYSICIAN:  Quita Skye. Hart Rochester, M.D.               DATE OF BIRTH:  1926-04-19   DATE OF PROCEDURE:  03/30/2002  DATE OF DISCHARGE:                                 OPERATIVE REPORT   PREOPERATIVE DIAGNOSIS:  Right superficial femoral, popliteal, and tibial  occlusive disease with severe claudication.   POSTOPERATIVE DIAGNOSIS:  Right superficial femoral, popliteal, and tibial  occlusive disease with severe claudication.   PROCEDURE:  Right common femoral to popliteal (above-knee) bypass using a 6  mm Gore-Tex graft with intraoperative arteriogram.   SURGEON:  Quita Skye. Hart Rochester, M.D.   ASSISTANT:  Loura Pardon.   ANESTHESIA:  Spinal.   DESCRIPTION OF PROCEDURE:  The patient was taken to the operating room and  placed in the supine position at which time satisfactory spinal anesthesia  was administered.  The right leg was prepped with Betadine scrub and  solution, draped in the routine sterile manner. An oblique incision was made  in the inguinal region.  The superficial and profunda femoris arteries were  dissected free.  All were diffusely diseased with severe plaque formation in  the common femoral artery particularly distally, but there was a soft spot  at the level of the inguinal ligament with an excellent pulse.  The disease  was felt to be too severe and diffuse for an endarterectomy.  The popliteal  artery was then exposed in the above-knee position through a medial incision  in the distal thigh, the saphenous vein having been previously removed for  coronary artery bypass grafting.  The popliteal artery was exposed down to  the knee joint.  It was a diffusely diseased vessel as well, but also had a  soft spot and was patent on the angiogram.  A subfascial anatomic  tunnel was  created and the patient was heparinized.  The popliteal artery was occluded  proximally and distally with clamps, opened with a 15 blade, and extended  with the Pott's scissors.  The vessel had good backbleeding, but was  diffusely diseased.  The Gore-Tex was spatulated and anastomosed end-to-side  using continuous 6-0 Prolene.  Following this, the femoral vessels were  occluded with clamps and a longitudinal opening made in the proximal common  femoral artery, extended up to the inguinal ligament.  There was excellent  inflow present.  The Gore-Tex was anastomosed end-to-side with 6-0 Prolene.  The clamps were released and there was good pulse in the graft and much  improved flow in the posterior tibial artery at the ankle.  Intraoperative  arteriogram revealed a widely patent anastomosis into a diseased popliteal  artery with two vessel runoff through the peroneal and  posterior tibial arteries.  Protamine  was given to reverse the heparin.  Following adequate hemostasis, the wound was irrigated with saline, closed  in layers with Vicryl and clips.  A sterile dressing was applied.  The  patient was taken to the recovery room in satisfactory condition.                                                 Quita Skye Hart Rochester, M.D.    JDL/MEDQ  D:  03/30/2002  T:  04/01/2002  Job:  [id number]

## 2010-12-26 NOTE — H&P (Signed)
NAME:  Dylan Jenkins, Dylan Jenkins NO.:  [id number]   MEDICAL RECORD NO.:  [id number]          PATIENT TYPE:  INP   LOCATION:  3019                         FACILITY:  [facility]   PHYSICIAN:  Stefani Dama, M.D.  DATE OF BIRTH:  Mar 15, 1926   DATE OF ADMISSION:  04/14/2005  DATE OF DISCHARGE:                                HISTORY & PHYSICAL   ADMISSION DIAGNOSES:  1.  Lumbar spondylosis.  2.  Lumbar stenosis.  3.  Lumbar radiculopathy.   ADMISSION DIAGNOSES:  Diabetes mellitus.   HISTORY OF PRESENT ILLNESS:  Dylan Jenkins is a 77 year old right-handed  individual who has had back pain for at least 6 month's time. He has had a  hard time walking and feels pain radiating to his hips, particularly on the  left side. He notes that he can only walk very short distances, perhaps less  than a city block, perhaps as far at 500 feet or so, before he has to stop  and rest. He finds that he gets nearly immediate relief when he sits down  for just a few seconds to a minute. Typically he goes shopping, he intends  to push a cart so that he can lean forward over it and this will tend to  lessen his pain. The severity of the pain seems to be increasing and is not  getting better despite the passage of time. He has had to increasingly limit  his activity.  A CT scan of the lumbar spine was evaluated on November 04, 2004  and this demonstrated the patient had spondylitic disease with worsening  singular areas of spondylitic stenosis at L2-3, L3-4 and L4-5. By the report  it suggested he had mild stenosis at best. The patient was subsequently  evaluated by me in the office on January 21, 2005. I suggested myelography and  post myelogram CT scan and this was performed on March 21, 2005. The study  was reviewed and demonstrated singular worse level of stenosis at L3, L4. He  has relative areas of stenosis at 2, 3 and L4-5, however clinical stenosis  would be suggested at the L3, L4 level and I suggested  treatment of this  process with placement of a ex-stop device and patient is now admitted for  this procedure.   PAST MEDICAL HISTORY:  1.  History of hypertension.  2.  Diabetes.  3.  He has evidence of coronary artery disease and has had a coronary artery      bypass surgery in 2000. He had a defibrillator placed for the last 4      years.  4.  He tells me he has had some peripheral arterial bypass surgery and has      peripheral vascular disease also present.   SOCIAL HISTORY:  He does not smoke. He does not use alcohol. Weight has been  stable at 228 pounds and he is 5 feet 7 inches tall.   REVIEW OF SYSTEMS:  Notable for the high blood pressure, swelling in the  feet and hands. Leg pain while walking. Shortness of breath and  urinary  incontinence on occasion. He has some back pain, leg pain. Anxiety  depression, diabetes are all noted on the 14 point review sheet.   MEDICATIONS:  1.  Glipizide ER 10 mg daily.  2.  Toprol XL.  3.  Digoxin 0.125.  4.  Lipitor 40.  5.  Relafen 500 mg b.i.d.  6.  Altace 10 mg two daily.  7.  Wellbutrin.  8.  Nitroglycerin patch.  9.  Patient was also recently started on Lantus Insulin.   PHYSICAL EXAMINATION:  GENERAL EXAM:  Reveals that he is an alert and  oriented and cooperative individual.  He stands straight and erect, however he tends to favor a 10 to 15 degree  forward stoop. His motor function is good in the iliopsoas, quadriceps,  tibialis anterior and the gastroc's though the patient does have difficulty  standing on his toes for any length of time. Tone and bulk in the lower  extremity musculature appear normal. Deep tendon reflexes are 2+ in the  patellae, 1+ in the Achilles. Babinski's are down going. Sensation is intact  in both distal lower extremities. Upper extremity strength and reflexes are  within the limits of normal. Cranial nerve examination reveals the pupils  are 4 mm, brisk and reactive to light and accommodation.  Extraocular  movements are full and the face is symmetric. Tongue and uvula are in the  midline. Sclerae and conjunctivae are clear.  NECK:  Reveals no masses, no bruits are heard.  HEART:  Has a regular rate and rhythm. Heart sounds are distant, no murmurs  are heard.  LUNGS:  Clear to auscultation.  ABDOMEN:  Soft, bowel sounds are positive. No masses are palpable.  EXTREMITIES:  Reveal no clubbing, cyanosis or edema.   IMPRESSION:  The patient has evidence of spondylitic disease in the lower  lumbar spine. He is now being admitted to undergo surgical decompression  using an Ex-stop device.      Stefani Dama, M.D.  Electronically Signed     HJE/MEDQ  D:  04/15/2005  T:  04/15/2005  Job:  [id number]

## 2010-12-26 NOTE — H&P (Signed)
[facility]  Patient:    Dylan Jenkins, Dylan Jenkins                    MRN: [id number] Adm. Date:  [id number] Attending:  Sharlet Salina CC:         Madaline Savage, M.D.  Doylene Canning. Ladona Ridgel, M.D. [facility]  Danice Goltz, M.D.   History and Physical  BRIEF HISTORY:  This pleasant 77 year old male was recently discharged September 07, 2000, from [facility] after admission for congestive heart failure, pansinusitis, and bronchospasm.  He has a history of ischemic cardiomyopathy, status post coronary artery bypass graft surgery x 6 in February of 2000.  He was found to have a wide-complex tachycardia while in the hospital at [facility] recently.  He had an EP study by Dr. Lewayne Bunting and had inducible ventricular tachycardia and inducible ventricular fibrillation. Subsequently, an internal defibrillator (single chamber) was inserted.  He was also apparently found to have a significant pansinusitis and was discharged home on a 12-day course of Augmentin which has now completed.  He was seen in the hospital by Dr. Danice Goltz.  At that time he was also started on Advair and Flonase.  He also has a history of GE reflux and was discharged home on Protonix 40 mg p.o. b.i.d.  DISCHARGE MEDICATIONS:  Other medications at the time of discharge included: 1. Micronase 5 mg p.o. q.a.m. 2. Trandate 300 mg p.o. b.i.d. 3. Zocor 40 mg daily. 4. Lasix 40 mg daily. 5. K-Dur 20 mEq daily. 6. Aspirin 325 mg daily. 7. Norvasc 5 mg daily. 8. Imdur 30 mg daily. 9. Lanoxin 0.125 mg daily.  Since discharge, on January 29 (one day after defibrillator was implanted) he has done very poorly.  He presented to [facility] Emergency Department February 13 and was seen by emergency department physician and subsequently by my on-call partner, Dr. Artis Flock for evaluation.  Wife noted he had been weak, lethargic, with decreased appetite.  However, his chest x-ray, CBC and CMET were within normal  limits and he was discharged home thinking he was basically depressed.  I had seen him August 17, 2000, in my office for complete physical examination and felt at that time he was also depressed and had given him samples of Prozac 20 mg daily.  He had not started the Prozac until after being seen in the emergency department February 13.  However, after his discharge from the hospital on January 29, wife reported that he was significantly nauseated and was not able to eat very much whatsoever.  He had very little appetite.  He seemed to get out of breath just going from bed to chair.  His affect was described as very flat and he was not very interested in anything and was not his usual gregarious self.  At the time of his admission, on January 21, he was febrile.  It was felt that he could have had a viral infection in addition to congestive heart failure. He had presented to my office in frank congestive heart failure, was given 40 mg IV Lasix and was transported by EMS to [facility] Emergency Department. After his arrival in the emergency department he apparently looked much better and chest x-ray did not show any frank congestive failure but by that time he had diuresed some.  He was definitely in congestive failure when he presented to the office.  However, he had a very similar presentation February 2000 just prior to his CABG  at which time it was felt he had congestive heart failure and pneumonia.  The patient has silent ischemia.  He never has chest pain and basically slips into heart failure over a period of three or four days.  His symptoms had actually started some four days prior to his presentation to my office.  At the time of his evaluation in the hospital on January 21, MI was ruled out.  He was found to have an ejection fraction of 20-25%.  He had cardiac catheterization by Dr. Alanda Amass and all of his bypass grafts were patent.  However, it was noted that he had significant  left ventricular dysfunction with inferior hypo/akinesis.  Ejection fraction was estimated at the time of cardiac catheterization to be 20-25%.  He basically has not done extremely well after his CABG; in other words, he has never quite gotten his energy back to normal and he has been somewhat depressed.  However, at this point, he has lost 27 pounds since August 17, 2000, is chronically nauseated and is extremely weak.  He can barely ambulate into the office without looking exhausted and his color is very ashened.  Yet he has no complaint of chest pain but does seem to have some air hunger at times.  There also seems to be some element of anxiety.  The only thing he can think of that would be good to eat at this point is macaroni and cheese.  He recently has been following a more strict diabetic diet and wife reports his Accu-Cheks have been stable but she has only been doing them b.i.d.  He has not had any type of syncope and has not fallen since his discharge.  GE reflux has not been a big problem in the remote past.  He thinks that he may be on some type of drug combination that has made him nauseated.  We did check a digoxin level at the time of admission today and it has been found to be within normal limits.  His CBC was essentially normal with the exception of a mildly decreased hemoglobin with a normal MCV.  His urinalysis today in the office was unremarkable but his prostate was felt to be slightly boggy.  He has had prostatitis in the past and has seen a neurologist.  We were concerned enough about his presentation in the office that we decided to admit him.  He has had a significant orthostatic drop in the office.  His blood pressure supine was 120/64 with pulse of 72, sitting blood pressure was 110/60 with pulse of 88 and standing his blood pressure was 104/60 with pulse of 84.  There was no evidence of infection about the internal defibrillator, i.e., there was no erythema  or  significant tenderness to be concerned about.  His chest was significantly clear but we did send him for a chest x-ray that showed no active disease. His Accu-Chek in the office was 130 some four hours or so postprandial.  He has only eaten today just a little bit of oatmeal with some orange juice. When I walked into the examination room he was complaining bitterly of nausea, fatigue and some shortness of breath.  He appeared to be slightly anxious.  He has a longstanding history of hypertension, history of hyperlipidemia treated with Zocor, history of osteoarthritis of the knees previously treated with Relafen in addition to coronary artery disease and adult onset diabetes mellitus.  He has been a diabetic since 1992.  He was initially diagnosed  with hypertension in 1987.  Thus, it was felt he had some mild volume depletion today in the office, ischemic cardiomyopathy, malaise, weight loss, and fatigue. He has been admitted for further evaluation and for gentle IV fluid hydration.  His wife is very concerned about his significant failure to improve, status post recent hospitalization.  They have spoken with Dr. Elsie Lincoln recently and his Lasix has been held, his Norvasc was held and his K-Dur 20 mEq was held.  ALLERGIES:  He has no known drug allergies.  PAST MEDICAL HISTORY:  He had bone spurs removed from his heels in 1971.  He had left eye cataract extraction in 1994 and his coronary artery bypass graft surgery in February of 2000.  He has had Pneumovax immunizations in 1994 and in January of 2001, respectively.  He did have an influenza immunization in November of 2001.  Tetanus immunization given in 1992 and that has not been updated because of a Scientist, clinical (histocompatibility and immunogenetics).  He has had one screening flexible sigmoidoscopy in 1992 which was normal.  He has had no history of elevated PSA but has had prostatitis treated by Dr. Vonita Moss.  In 1988, he was seen for an exercise tolerance test  and that proved to be negative.  He also had an ultrasound of his gallbladder around that time that showed small polyps in the gallbladder.  He also had some epigastric pain in April of 1988 after starting Vasotec.  He was no endoscoped at that point.  SOCIAL HISTORY:  He is married and has four adult children, three sisters in good health.  The patient is a former heavy smoker, two packs a day for some 50 years but quit in 1987.  No significant alcohol consumption.  Enjoys golf and has a home at [place].  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  He still works some at [employer] but has cut back his work hours quite a bit. 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  FAMILY HISTORY:  Remarkable for father who died at age 39 of arterial sclerotic cerebrovascular disease.  Mother died at age 4 with hypertension and stroke.  Father had a history of arthritis and father apparently also had a history of stroke.  One brother.  REVIEW OF SYSTEMS:  The patient has general complaints of fatigue, malaise, shortness of breath with exertion and lack of energy in general.  No complainT of diarrhea or abdominal pain.  No documented hypoglycemia based on Accu-Cheks that his wife brings to the office today.  No shaking chills.  No documented fever.  The patients affect in the office is extremely flat.  At times he says he feels "fuzzy" while he is talking with [place].  He says sometimes he feels a bit dizzy when he stands suddenly.  He thinks that since starting Prozac his energy level is slightly better.  OBJECTIVE:  SKIN:  Somewhat ashened in color but pulse oximetry in the office today is 98%.  Skin is warm and dry.  LYMPH NODES:  None.  HEENT:  Discs are sharp and flat  bilaterally on funduscopic examination. PERRLA.  Tongue is dry.  Mucous membrane are slightly dry.  TMs are clear. Pharynx is clear.  NECK:  Supple.  No JVD, thyromegaly or  carotid bruits.  CHEST:  Clear to auscultation.  CARDIAC:  Regular rate and rhythm, normal S1 and S2.  ABDOMEN:  Bowel sounds are active.  No hepatosplenomegaly, masses or tenderness.  Stool is guaiac-negative.  EXTREMITIES:  Without lower extremity edema.  NEUROLOGICAL:  He has lethargy and generalized flat affect but is alert and oriented and there are no focal deficits appreciated on brief neurological examination.  IMPRESSION: 1. Fatigue, weight loss of 27 pounds since early January, generalized    weakness, ?infection versus worsening ischemic cardiomyopathy and    chronic obstructive pulmonary disease. 2. History of pansinusitis with recent admission. 3. History of wide-complex tachycardia with inducible ventricular fibrillation    and ventricular tachycardia at electrophysiology study treated with    implantation of internal defibrillator. 4. Adult onset diabetes mellitus. 5. Hypertension. 6. Hyperlipidemia. 7. History of gastroesophageal reflux. 8. History of osteoarthritis. 9. Depression. 10. Probable prostatitis.  Rectal examination today reveals a slightly     boggy prostate and he did have greater than 100,000 colonies per     mL coagulase-negative staphylococcus on urine culture with recent     hospitalization.  This could be significant given that he is a     diabetic.  Also, I am concerned about his persistent daily nausea.  I     am not sure if this is coming from some medication or combination of     medications, related to anxiety or related to an organic cause.  PLAN:  The patient will have a rather detailed work-up including blood cultures, urine culture, chest CT to look for occult lung tumor and pulmonary emboli, although it is noted that his O2 saturation on room air today is  98%. He will have a 2-D echocardiogram to look for vegetations and look at left ventricular function which we know already is abnormal.  Will receive gentle  IV fluid hydration with D-5 one-half normal saline with 10 mEq of potassium chloride per liter at 50 cc/h.  He will have dietary consultation for food preferences.  We are not going to try to restrict him to a really strict diabetic diet at this point but he will maintain a sodium restriction of 4 g. Accu-Cheks will be checked a.c. and h.s. and documented to see if he is having episodes of hypoglycemia.  Thyroid functions will be checked.  We will also check a CEA.  His recent PSA in January was within normal limits and that will not be repeated.  He will have an upper GI and an ultrasound of his gallbladder because complaints of persistent nausea.  We are also going to repeat a limited CT of his sinuses and see if that has improved since January 21.  He will be started on Cipro 500 mg p.o. b.i.d. for presumed prostatitis. This will also be reasonable treatment if his sinusitis is persistent.  We will have physical examination evaluate him and hopefully assist with gait strengthening.  He will be given Phenergan as needed for nausea for now. If he is anxious he may be given Klonopin and we will continue Prozac but reduce dose to 10 mg daily.  Protonix will be reduced to 40 mg q.d. instead of b.i.d..  The patient definitely has rather severe ischemic cardiomyopathy and he may never be back to his baseline prior to his CABG in February of 2000. However, I think we can anticipate that he will improve somewhat and certainly be able to have some quality of life.  He has  significant orthostatic hypotension which I think is due to volume depletion at this point.  I agree with holding his Lasix for now.  He probably needs to be on a low-dose ACE inhibitor for the congestive heart failure.  We need to make sure he does not have some  significant infection that we have overlooked at this point.  A CEA will be drawn.  ANA will be checked.  Also, we will rule out vitamin deficiencies with iron and iron-binding capacity, B12 and folate levels. Serum magnesium will be checked as suggested by Dr. Alanda Amass.  He will be seen in consultation by Dr. Alanda Amass from [facility].  DD: 10/01/00 TD:  10/04/00 Job: [id number]

## 2010-12-26 NOTE — Op Note (Signed)
NAME:  TAITEN, BRAWN NO.:  [id number]   MEDICAL RECORD NO.:  [id number]          PATIENT TYPE:  INP   LOCATION:  3019                         FACILITY:  [facility]   PHYSICIAN:  Stefani Dama, M.D.  DATE OF BIRTH:  [birth year]   DATE OF PROCEDURE:  04/15/2005  DATE OF DISCHARGE:                                 OPERATIVE REPORT   PREOPERATIVE DIAGNOSES:  1.  Lumbar spondylosis,  2.  Lumbar stenosis L3-L4.   POSTOPERATIVE DIAGNOSES:  1.  Lumbar spondylosis.  2.  Lumbar stenosis, L3-4.   PROCEDURE:  Placement of an X-stop device, L3-4.   INDICATIONS:  Mr. Shimshon Narula is a 77 year old individual who has had  symptoms of neurogenic claudication with lumbar radicular findings also and  stenosis on the L3-L4 level.  He is now taken to the operating room to  undergo placement of an X Stop device.   PROCEDURE:  The patient was brought to the operating room supine on the  stretcher.  After the smooth induction of general endotracheal anesthesia,  he was turned prone.  The back was prepped with DuraPrep and draped in  sterile fashion.  A midline incision was created over the L3-L4 level.   Dictation ends at this point.      Stefani Dama, M.D.  Electronically Signed     HJE/MEDQ  D:  04/15/2005  T:  04/15/2005  Job:  [id number]

## 2010-12-26 NOTE — Op Note (Signed)
NAME:  Dylan Jenkins, Dylan Jenkins                           ACCOUNT NO.:  [id number]   MEDICAL RECORD NO.:  [id number]                   PATIENT TYPE:  OIB   LOCATION:  NA                                   FACILITY:  [facility]   PHYSICIAN:  Quita Skye. Hart Rochester, M.D.               DATE OF BIRTH:  08-05-1926   DATE OF PROCEDURE:  08/16/2002  DATE OF DISCHARGE:                                 OPERATIVE REPORT   PREOPERATIVE DIAGNOSES:  Severe left renal artery stenosis with hypertension  and renal insufficiency.   POSTOPERATIVE DIAGNOSES:  Severe left renal artery stenosis with  hypertension and renal insufficiency.   OPERATION:  1. Left renal angiogram via right common femoral approach.  2. PTA and stenting of left renal artery using a PG 1860 BSS catheter at 10     atmospheres for 30 seconds, and a PG 1560 BSS at eight atmospheres for 30     seconds.   SURGEON:  Dr. Josephina Gip and Dr. Liliane Bade.   ANESTHESIA:  Local Xylocaine and Versed 2 mg intravenously, contrast 75 cc,  heparin 4,000 units.   COMPLICATIONS:  None.   DESCRIPTION OF PROCEDURE:  Patient was taken to the [facility], placed in the supine position at which time both groins  were prepped with Betadine solution and draped in routine sterile manner.  After infiltration of 1% Xylocaine the right common femoral artery was  entered percutaneously.  Guide wire passed in to the super renal aorta.  A 6-  French sheath and dilator were passed over the guide wire, dilator removed  and a 6 mm guide catheter was used to selectively cannulate the left renal  artery which was done without difficulty.  Left renal angiogram was  performed through the guide catheter which revealed 90% stenosis of the left  renal artery beginning at the ostium and extending out about 15 mm.  A 0.018  wire was then passed through the guide catheter in to the distal renal  artery in to a secondary branch and the guide catheter was gently  advanced  in to the distal renal artery across the lesion.  Four thousand units of  heparin had been given intravenously.  Using the Genesis system an 18 mm x 6  mm catheter premounted stent was positioned across the lesion and checked  angiographically.  Initial dilation was done at 10 atmospheres for 30  seconds.  The catheter was withdrawn and a postangioplasty angiogram  revealed a good cosmetic result proximally but a slight ridge at the distal  end of the stent.  It was decided to use a second stent to extend this out  distally and therefore a 15 mm x 6 mm premounted stent was utilized and  inflated at 8 atmospheres for 30 seconds.  Post angioplasty angiogram was  then performed which revealed widely patent renal artery from the  aorta out  across the lesion.  The guide catheter guide wire was then removed and after  the heparin was reversed the sheath was removed, adequate compression  applied.  No complications ensued.    FINDINGS:  1. Severe left renal artery stenosis (90%).  2. Successful PTA and stenting of left renal artery stenosis using a) a PG     1860 BSS at 10 atmospheres for 30 seconds and b) a PG 1560 BSS at 8     atmospheres for 30 seconds.                                               Quita Skye Hart Rochester, M.D.    JDL/MEDQ  D:  08/16/2002  T:  08/16/2002  Job:  [id number]

## 2010-12-26 NOTE — Discharge Summary (Signed)
NAME:  DAVONTAE, PRUSINSKI NO.:  [id number]   MEDICAL RECORD NO.:  [id number]          PATIENT TYPE:  INP   LOCATION:  3019                         FACILITY:  [facility]   PHYSICIAN:  Stefani Dama, M.D.  DATE OF BIRTH:  [birth year]   DATE OF ADMISSION:  04/14/2005  DATE OF DISCHARGE:  04/15/2005                                 DISCHARGE SUMMARY   ADMITTING DIAGNOSIS:  1.  Lumbar spondylosis.  2.  Lumbar stenosis.  3.  Lumbar radiculopathy.  4.  Neurogenic claudication.   DISCHARGE AND FINAL DIAGNOSIS:  1.  Lumbar stenosis.  2.  Lumbar radiculopathy.  3.  Lumbar spondylosis.  4.  Diabetes mellitus.  5.  Postoperative urinary retention.   CONDITION ON DISCHARGE:  Stable.   HOSPITAL COURSE:  Mr. Ulises Wolfinger is a 77 year old individual who has had  significant problems with back and leg pain.  He has evidence of neurogenic  claudication and has a tight stenosis at the level of L3-L4 demonstrated by  myelography several weeks ago.  After careful consideration of his options I  advised the placement of an X-stop  device.  Patient was taken to the  operating room where he underwent this procedure.  Tolerated it well.  Postoperatively is ambulatory.  He did have an episode of urinary retention  which caused the need for straight catheterization.  His pain has been  controlled with oral medications and he is discharged home with a  prescription for Percocet #50 without refills, Valium 5 mg #30 without  refills.  He will be seen in the office in three weeks' time for further  follow-up.      Stefani Dama, M.D.  Electronically Signed     HJE/MEDQ  D:  04/15/2005  T:  04/15/2005  Job:  [id number]

## 2010-12-26 NOTE — Procedures (Signed)
[place]  Patient:    Dylan Jenkins, Dylan Jenkins                        MRN: [id number] Proc. Date: 09/06/00 Adm. Date:  [id number] Attending:  Ophelia Shoulder CC:         Madaline Savage, M.D.  Genia Del, M.D.   Procedure Report  PROCEDURE PERFORMED:  Implantation of ICD and EP study.  I:  INTRODUCTION:  The patient is a pleasant 77 year old male with an ischemic cardiomyopathy and an ejection fraction of 25%.  He was admitted to the hospital with congestive heart failure.  He was subsequently found to have nonsustained wide QRS tachycardia, both of the right bundle as well as left bundle-branch block morphology.  He is subsequently referred for EP testing and ICD implantation.  II:  DESCRIPTION OF PROCEDURE:  After informed consent was obtained, the patient was taken to the diagnostic EP Lab in the fasting state.  After usual preparation and draping, intravenous fentanyl and midazolam was given for sedation.  A total of 30 cc of lidocaine was infiltrated into the left infraclavicular region.  At this point, electrocautery was utilized to dissect down to the subpectoralis fascia.  A total of 20 cc of IV contrast was injected into the left upper extremity venous system demonstrating a patent left subclavian vein.  The vein was then punctured and an 11 French peel-away sheath advanced into the left subclavian vein.  The Guidant, model [id number], serial [id number] defibrillation lead was advanced into the right ventricle.  The right ventricle was then carried out at the acceptable site, R wave was measured at 25 mV and the ventricular pacing threshold was 0.6 volts at 0.5 msec with a pacing impedence of 853 ohms.  With the lead in satisfactory position, it was secured to the pectoralis fascia with a figure-of-eight suture.  A silk suture was then utilized to secure the sewing sleeve.  At this point, electrocautery was utilized to make a  subcutaneous pocket.  Attention was then turned at maintenance of hemostasis which was also obtained with electrocautery.  Kanamycin was then irrigated into the pocket and the Guidant Ventak, VR AICD, model #1860, serial [id number] was connected to the defibrillation lead and placed into the subcutaneous pocket.  The generator rotor was secured to the pocket with a silk suture.  Additional kanamycin irrigation was utilized to irrigate the wound.  EP testing was then carried out.  EP testing was carried out at basic dry cycle length of 6400 msec.  An S1, SS2 S3, S4, coupling interval of 400/250/200/230.  There was inducible monomorphic VT.  This had a cycle length of 260 msec.  It terminated spontaneously.  The VT morphology was a left bundle, left inferior axis.  Additional pacing results with the initiation of ventricular fibrillation with S1, S2, S3, S4 stimuli and sinus rhythm was subsequently restored with a 17 joule shock.  At this point, five minutes was allowed to elapse and a second DFT test was carried out.  At this time VF was induced and a T wave shock at 17 joules again restored sinus rhythm.  At this point, the wound was closed with a layer of 2-0 Vicryl followed by a layer of 3-0 Vicryl, followed by a layer of 4-0 Vicryl.  Benzoin was painted on the skin and Steri-Strips were applied and a pressure dressing placed, and the patient returned to his room in good condition.  III:  COMPLICATIONS:  There were no immediate procedure complications.  IV:  RESULTS:  This demonstrates inducible ventricular tachycardia, as well as inducible ventricular fibrillation, as well as successful implantation of a Guidant, single-chamber defibrillator in a patient with inducible ventricular tachycardia and ischemic cardiomyopathy with nonsustained ventricular tachycardia and an ejection fraction at baseline, and an ejection fraction of 25%. DD:  09/06/00 TD:  09/07/00 Job: [id number]

## 2010-12-26 NOTE — Procedures (Signed)
[place]  Patient:    Dylan Jenkins, AGER                        MRN: [id number] Proc. Date: 09/02/00 Adm. Date:  [id number] Attending:  Ophelia Shoulder CC:         CP Lab   Procedure Report  PROCEDURE:  Retrograde central aortic catheterization, right heart catheterization, retrograde left heart catheterization, thermodilution cardiac output, simultaneous A-V O2 difference, thermodilution cardiac output, native coronary angiography by Judkins technique two injections, saphenous vein graft angiography limited injections (6-7 hand injections) LV angiogram hand injection 5-7 cc, abdominal aortic angiogram hand injection 5 cc.  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  He quit smoking in 1989, has four children and 11 grandchildren. He has known severe coronary artery disease and underwent multivessel CABG on September 24, 1998.  He underwent early restudy Dec 20, 1998, was found to have patent grafts with LV dysfunction and an EF of 25%.  He has AODM, arthritis, hypertension, hyperlipidemia, GERD.  He has been on ACE inhibitors, diuretics, and nonsteroidals as an outpatient.  He was admitted with shortness of breath, and it was felt that he might have a viral syndrome.  It was difficult to distinguish this from associated congestive heart failure.  He has had a bronchitic-type illness with exacerbation of his hypertension and transient elevation in his creatinine.  Blood and urine cultures were negative.  He was on empiric antibiotics.  The patient had asymptomatic 23-beat run of ventricular tachycardia at plus-minus 150 per minute, wide-complex.  His underlying EKG shows right bundle branch block with right axis deviation and secondary ST-T wave changes with old inferior MI by Q-waves.  It was recommended that he undergo catheterization evaluation to rule out an ischemic etiology for his  nonsustained VT.  DESCRIPTION OF PROCEDURE:  The patient was brought to the second floor CP lab in the late afternoon after hydration during the day.  His creatinine had come down to 1.7 with a BUN of 31.  The right groin was prepped, draped in the usual manner.  Xylocaine 1% was used for local anesthesia.  The RFA and RFV were both entered with a modified Seldinger technique using an 18 thin-walled needle, and 6 and 8 French arterial and venous sheaths were inserted, respectively.  Right heart catheterization was done with a triple-lumen, flow-directed Swan-Ganz catheter.  Retrograde left heart catheterization was done with a 6 French Cordis pigtail catheter.  Isovue due (iso-osmolar, nonionic) was used throughout the procedure.  Simultaneous LVPCW wedges were recorded, along with flow back to the PA and subsequent thermodilution cardiac outputs.  Right heart pullback pressures were then recorded.  Simultaneous A-V O2 difference were obtained before pullback.  A limited 5 cc hand injection of the LV was performed.  This was limited dye, but it did appear that the patient had a severely depressed global EF of approximately 20-25% in the RAO projection estimated.  There was inferior hypo-akinesis.  The catheter was pulled down above the level of the renal arteries, and a hand injection demonstrated a single patent right renal artery and high-grade segmental 85-90% left renal artery stenosis.  There was irregularity in the infrarenal position but no aneurysm noted on his limited hand injection.  Native coronary angiography was done with a 6 [place], 4 cm tapered Cordis preformed catheter with two left coronary injections and one right  coronary injection.  Saphenous vein graft angiography was done with the right coronary catheter for the SVG to the RCA, SVG to the diagonal.  A left coronary 6 French bypass catheter was required for selective injection of the SVG to the OD.  LIMA injection  was done with a 6 [place] IMA catheter.  Because of marked aortoiliac tortuosity, the right coronary catheter was used to enter the subclavian artery.  A Wholey wire was used, and then a long exchange wire for a 6 [place] IMA catheter.  Limited injections of the graft and IMA were done, approximately six to eight injections.  The catheters were removed, sidearm sheaths were flushed, pending review of the cineangiograms.  He tolerated the diagnostic procedure well and will be transferred to the holding area for sheath removal and pressure hemostasis.  RESULTS:  Pressures:  RA:  17/15; mean 17 mmHg.  RV:  45/6; mean 16 mmHg.  PA:  44/15; mean 27 mmHg.  PCW:  26/12, mean 22 mmHg.  CA:  133/69; mean 92 mmHg.  LV: 133/8; LVEDP 18 mmHg.  CO/CI equals 3.9/3.8 L/min per sq m.  PA saturation 69%; LA saturation 96%; A-V O2 difference 4.9 volume percent.  Total pulmonary resistance 451; peripheral vascular resistance 1927 VSC to the minus fifth.  There was no gradient between the LV and CA on catheter pullback.  There was no gradient across the mitral valve with simultaneous recorded pressures.  LV angiogram with limited dye by hand injection showed severe LV dysfunction, EF approximately 20-25% with inferior hypo-akinesis.  There was +2 coronary calcification of all three vessels and no significant intracardiac or valvular calcification.  There was 90% concentric ostial and proximal segment narrowing of the left renal artery.  Native coronary angiography:  The main left coronary was small, but no significant obstruction.  The left anterior descending artery was totally occluded after several septal perforators, and then very small first diagonal branch.  The native circumflex gave off no marginals, since they were occluded.  There was 40-50% narrowing in the proximal third, 95% in the midportion before several small distal branches were visualized.  The saphenous vein graft to the  distal circumflex was widely patent with an  excellent anastomosis to the moderately large distal PDA with retrograde filling of two large branches, probably a PLA and marginal.  Saphenous vein graft to the very small diagonal showed a good anastomotic site.  There was 40% concentric segmental narrowing proximally, a lumpy, bumpy appearance throughout the remainder of the graft, with 30-40% narrowing and some dye streaming but no high-grade obstruction.  Saphenous vein graft to the RCA was widely patent and inserted into the mid-distal RCA with filling of the small PDA and PLA and is probably a nondominant vessel, and retrograde filling of two moderate-sized RV branches.  The native RCA was totally occluded in its proxmial third after a conus branch and atrial branch.  DISCUSSION:  The patients history is as outlined above.  He has moderate resting pulmonary hypertension at present, fairly good fluid balance with LVEDP 18-20 mmHg, and patent grafts.  He has severe LV dysfunction and with his nonsustained VT, I do not think there is a primary ischemic cause identified at catheterization with his patent grafts as outlined.  He has a history of chronic LV dysfunction, which has not improved since surgery.  With his COPD, he would be a poor candidate for empiric amiodarone therapy and since he is in such a high-risk group, I think he  is a candidate for EPS-guided therapy.  With his combined upper respiratory admission illness and probable component of heart failure, there is concern about his high-grade left renal artery stenosis.  With his underlying diabetes and transient renal insufficiency and hypertension, he is a potential candidate for further renal artery evaluation with definitive angiography (dye or CO2 angiogram as necessary) and possible PTCA and stenting.  At present, he will be hydrated postoperatively and his renal function followed carefully post-dye.  Nonionic,  iso-osmolar dye was used.  He was hydrated preoperatively, and his ACE, nonsteroidals, and diuretics will be held postoperatively until we are sure his renal function is stable.  CATHETERIZATION DIAGNOSES:  1. Atherosclerotic heart disease, severe three-vessel coronary disease.  2. Severe left ventricular dysfunction.  3. Prior multivessel coronary artery bypass graft September 24, 1998 x 6.  4. Adult onset diabetes mellitus.  5. Transient renal insufficiency.  6. Systemic hypertension, recent exacerbation associated with congestive     heart failure and nonsustained ventricular tachycardia.  7. High-grade left renal artery stenosis.  8. Exogenous obesity.  9. Hyperlipidemia. 10. Osteoarthritis, on nonsteroidal agents. 11. Gastroesophageal reflux disease. 12. Nonsustained ventricular tachycardia.DD:  09/02/00 TD:  09/03/00 Job: [id number]

## 2010-12-26 NOTE — Op Note (Signed)
NAME:  Dylan Jenkins, Dylan Jenkins                           ACCOUNT NO.:  [id number]   MEDICAL RECORD NO.:  [id number]                   PATIENT TYPE:  INP   LOCATION:  NA                                   FACILITY:  [facility]   PHYSICIAN:  Quita Skye. Hart Rochester, M.D.               DATE OF BIRTH:  [birth year]   DATE OF PROCEDURE:  03/23/2002  DATE OF DISCHARGE:                                 OPERATIVE REPORT   PREOPERATIVE DIAGNOSIS:  Bilateral lower extremity claudication, right worse  than left via femoral-popliteal occlusive disease.   PROCEDURE:  Abdominal aortogram with bilateral lower extremity runoff via a  right common femoral approach with selective catheterization left common  iliac artery.   SURGEON:  Quita Skye. Hart Rochester, M.D.   ANESTHESIA:  Local Xylocaine.  Versed 2 mg intravenously.   DESCRIPTION OF PROCEDURE:  The patient was taken to the [facility]  Peripheral Endovascular Lab, placed in to the supine position, at which time  both groins were prepped with Betadine solution and draped in routine  sterile manner.  After the infiltration of 1% Xylocaine, the right common  femoral artery was entered percutaneously.  Guide wire passed into the  suprarenal aorta under fluoroscopic guidance.  The 5 French sheath and  dilator were passed over the guide wire.  The dilator removed and the  standard pigtail catheter was positioned in the suprarenal aorta.  Flush  abdominal aortogram was performed injecting 20 cc of contrast at 20 cc per  second.  This revealed the aorta to be mildly diseased with atherosclerosis  but with widely patent with no significant stenoses in the aortoiliac  system.  The right renal artery was widely patent.  The left renal artery  had an 80-90% stenosis involving its proximal 2 cm with poststenotic  dilatation.  This was better visualized using both the RAO and LAO  projections.  The best view being the RAO with some cranial caudad  angulation.   The catheter was  withdrawn in the terminal aorta and bilateral lower  extremity runoff performed injecting 88 cc of contrast at 8 cc per second.  This revealed the right profunda to be irregular and diseased.  The right  superficial femoral artery had many areas of severe stenosis with essential  total occlusion with the vessel becoming fairly good quality, about 6 cm  above the knee joint.  Traverse the knee joint widely patent with two vessel  runoff through diseased peroneal and posterior tibial artery.   On the left side, superficial femoral artery was diffusely diseased but  patent with no severe stenoses.  The popliteal artery was diseased behind  the knee on the left and there was two vessel runoff on the left via the  posterior tibial and peroneal arteries.  These were better visualized using  the peak-hole technique by selectively cannulating the left common iliac  artery with a IMA  catheter for  the left side and the right side through the  sheath.   Having tolerated the procedure well, the sheath was removed.  Adequate  compression applied and no complications insured.   FINDINGS:  1. Mild aortoiliac occlusive disease with no severe stenoses.  2. Severe left renal artery occlusive disease with 80-90% proximal stenosis     and widely patent right renal artery.  3. Diffuse right superficial femoral occlusive disease with essential total     occlusion and reconstitution above the knee. Popliteal artery about 6 cm     with two vessel runoff in the posterior tibial and peroneal arteries     which are     diseased.  4. Diffuse mild left superficial femoral occlusive disease with stenosis of     the popliteal artery behind the knee, two vessel runoff posterior tibial     and peroneal arteries.                                                     Quita Skye Hart Rochester, M.D.    JDL/MEDQ  D:  03/23/2002  T:  03/26/2002  Job:  [id number]   cc:   Quita Skye. Hart Rochester, M.D.   Catheter lab

## 2010-12-26 NOTE — H&P (Signed)
NAME:  Dylan Jenkins, Dylan Jenkins NO.:  [id number]   MEDICAL RECORD NO.:  [id number]                   PATIENT TYPE:   LOCATION:                                       FACILITY:   PHYSICIAN:  Quita Skye. Hart Rochester, M.D.               DATE OF BIRTH:  12-Sep-1925   DATE OF ADMISSION:  08/16/2002  DATE OF DISCHARGE:                                HISTORY & PHYSICAL   CHIEF COMPLAINT:  Hypertension with 80% to 90% left renal artery stenosis,  normal right renal artery.   HISTORY:  The patient is a 77 year old white male medical patient of Dr.  Lenord Fellers with a history of hypertension, coronary artery disease, status post  coronary bypass grafting x6 on 09/24/1998 by Dr. Dorris Fetch.  The patient  has a history of right fem pop bypass graft and recently underwent repeat  arteriogram.  Arteriogram shows an 80% to 90% left renal artery stenosis.  Because of the patient's ongoing hypertension, increased medications to  control this it was Dr. Candie Chroman opinion patient that the should undergo  percutaneous transluminal angioplasty of his left renal artery stenosis.  He  is admitted for the same at this time.   PAST MEDICAL HISTORY:  1. Peripheral vascular occlusive disease.  2. Coronary artery disease with ischemic cardiomyopathy, ejection fraction     25%.  3. He has a history of ventricular tachycardia with ICD placement.  4. History of COPD.  5. Adult onset diabetes mellitus, type 2, non-insulin-dependent for 15     years.  6. Hyperlipidemia.  7. Gastroesophageal reflux disease.  8. Osteoarthritis.  9. History of sinusitis.   PROCEDURE:  1. Coronary artery bypass graft x6 in 09/24/1998.  2. Right femoral to popliteal bypass graft above knee with six mm Gore-Tex     03/30/2002, Dr. Hart Rochester.  3. Status post abdominal aortogram with run off.   MEDICATIONS:  1. Toprol XL 50 mg q.d.  2. Protonix 40 mg q.d.  3. Digitek 0.125 mg q.d.  4. Glucotrol 5 mg q. a.m. a.c.  5.  Wellbutrin SR 150 po b.i.d.  6. Altace 10 mg po b.i.d.  7. Zocor 40 mg q.d. h.s.  8. Aspirin 325 mg one q.d.  9. Multivitamin Centrum one q.d.  10.      Nitroglycerin 0.4 mg patch q.d.   REVIEW OF SYSTEMS:  GENERAL:  Generally patient is stable.  He complains of  general tiredness, lack of energy.  He is not able to do very much because  he is so tired and he becomes short of breath with any exertion.  LUNGS:  He  denies PND or orthopnea.  He has a positive cough but it is clear.  CARDIAC:  He denies any cardiac discomfort, he denies any shortness of breath at rest.  GI:  He denies any GI symptoms.  GU:  He has some problems with voiding  in  the p.m. secondary to BPH. GU as noted. EXTREMITIES:  He has no complaints  with claudication at this time.   FAMILY HISTORY:  His mother died at age 40.  She had bilateral hip  replacements and hypertension but was in good health all her life.  His  father died at age 63 of coronary artery disease.  He has four siblings, all  in good health.  His brother is 50 and in good health.   SOCIAL HISTORY:  He has been married for 52 years.  They have found  children, in good health.  He used some beer occasionally.  He smoked two  packs a day for 30 years but quit in 1989.  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   PHYSICAL EXAMINATION:  GENERAL:  This is a well-nourished, well-developed  white male, in no acute distress.  He was able to walk to my exam room from  the main lobby without difficulty.  HEAD:  Normocephalic, eyes PERRLA, EOM intact, fundi not visualized.  Ears,  nose, throat, mouth - within normal limits.  VITAL SIGNS:  Blood pressure 138/80 in the left arm and 120/80 in the right  arm, pulse 76 and regular, respirations 18 and unlabored.  HEENT:  Head normocephalic, eyes PERRLA, EOM intact, fundi normal.  Ears,  nose, throat and mouth grossly within normal limits.  NECK:   Supple, no JVD, no bruits, no thyromegaly, no lymphadenopathy.  CHEST:  No wheezing or rales or rhonchi.  CARDIAC:  Regular rate and rhythm, no murmurs, rubs or gallops.  ABDOMEN:  Soft, nontender, positive bowel sounds, no hepatosplenomegaly.  GU/RECTAL:  Deferred.  EXTREMITIES:  No clubbing, cyanosis.  He did have +1 edema in both lower  extremities.  No ulcerations.  Feet were slightly cool.  Pulses were +2 in  the carotid, +1 in the femorals, not palpated below the femorals.  NEUROLOGIC:  Cranial nerves are intact, no focal deficits noted.   IMPRESSION:  1. Severe left renal artery stenosis with hypertension.  2. Status post CABG x6 with ischemic cardiomyopathy, ejection fraction 25%.  3. History of supraventricular tachycardia with ICD implantation.  4. Chronic obstructive pulmonary disease.  5. Adult onset diabetes mellitus, type 2, non-insulin dependent.  6. Hyperlipidemia.  7. Osteoarthritis.   PLAN:  The patient admitted for angiogram and PTA and stenting of his left  renal artery stenosis.     Eber Hong, P.A.                 Quita Skye Hart Rochester, M.D.   WDJ/MEDQ  D:  08/14/2002  T:  08/14/2002  Job:  [id number]   cc:   Eber Hong, P.A.  [address]  [place], [place] [address]  Fax: 1

## 2010-12-26 NOTE — Op Note (Signed)
NAME:  Dylan Jenkins, Dylan Jenkins NO.:  [id number]   MEDICAL RECORD NO.:  [id number]          PATIENT TYPE:  INP   LOCATION:  3019                         FACILITY:  [facility]   PHYSICIAN:  Stefani Dama, M.D.  DATE OF BIRTH:  May 22, 1926   DATE OF PROCEDURE:  04/14/2005  DATE OF DISCHARGE:                                 OPERATIVE REPORT   PREOPERATIVE DIAGNOSIS:  Lumbar stenosis L3-L4 with lumbar radiculopathy.   POSTOPERATIVE DIAGNOSIS:  Lumbar stenosis L3-L4 with lumbar radiculopathy.   OPERATIONS:  Insertion of X-Stop device L3-L4.   INDICATIONS:  [name] is a 77 year old individual who had significant  central and lateral recess stenosis at the L3-L4 level secondary to  spondylitic overgrowth of the facettes and marked disk degeneration. He has  been having symptoms of neurogenic claudication and was advised regarding  surgical decompression using an X-Stop device.   PROCEDURE:  The patient was brought to the operating room supine on the  stretcher. After smooth induction of general endotracheal anesthesia he was  turned prone. Back was prepped with DuraPrep and draped in sterile fashion.  Fluoroscopic guidance was used to localize the L3-L4 area. The skin above  this area was infiltrated with lidocaine 1% with epinephrine mixed 50/50  with 1/2% Marcaine, a total of 30 mL was used in both subcutaneous and the  deep tissues. Vertical incision was created and carried down to the  lumbodorsal fascia. Fascia was opened on either side of midline and then  again the interspinous space at L3-4 was identified positively with a  radiograph. Subperiosteal dissection was undertaken under the lamina and  self-retaining retractors were placed in the wound. A small awl was then  placed through the interspinous ligament at the base of the spinous  processes at L3-4. This was enlarged with a larger awl and then the  interspinous ligament spreader was placed into that opening and  gradually  the interspace was distracted to a maximum distraction of 14 mm. It was felt  that with this distraction 12 mm spacer could be used. With some effort the  12 mm X-Stop was placed in the interspinous space at L3-L4. The soft tissues  were cauterized away from the lateral wing and the opposite wing was  attached with the set screw. Wings were compressed against the spinous  processes at L3-L4 and the set screw was tightened into this position. AP  and lateral radiography checked good position of the X-Stop device.  Fluoroscopic guidance was used with the distraction tool which identified  good amount of distraction that was achieved. The wound was copiously  irrigated with antibiotic irrigating solution. The lumbodorsal fascia was  closed #1 Vicryl interrupted fashion, 2-0 Vicryl used subcutaneously and 3-0  Vicryl subcuticularly. Dermabond was placed on the skin. The patient  tolerated procedure well. Blood loss estimated 50 mL or less.      Stefani Dama, M.D.  Electronically Signed     HJE/MEDQ  D:  04/14/2005  T:  04/14/2005  Job:  [id number]

## 2011-02-09 ENCOUNTER — Encounter: Payer: Self-pay | Admitting: Internal Medicine

## 2011-02-27 ENCOUNTER — Ambulatory Visit (INDEPENDENT_AMBULATORY_CARE_PROVIDER_SITE_OTHER): Payer: Medicare Other | Admitting: Internal Medicine

## 2011-02-27 ENCOUNTER — Encounter: Payer: Self-pay | Admitting: Internal Medicine

## 2011-02-27 DIAGNOSIS — K219 Gastro-esophageal reflux disease without esophagitis: Secondary | ICD-10-CM

## 2011-02-27 DIAGNOSIS — M1990 Unspecified osteoarthritis, unspecified site: Secondary | ICD-10-CM

## 2011-02-27 DIAGNOSIS — F329 Major depressive disorder, single episode, unspecified: Secondary | ICD-10-CM

## 2011-02-27 DIAGNOSIS — M48061 Spinal stenosis, lumbar region without neurogenic claudication: Secondary | ICD-10-CM

## 2011-02-27 DIAGNOSIS — I2510 Atherosclerotic heart disease of native coronary artery without angina pectoris: Secondary | ICD-10-CM

## 2011-02-27 DIAGNOSIS — E785 Hyperlipidemia, unspecified: Secondary | ICD-10-CM

## 2011-02-27 DIAGNOSIS — I359 Nonrheumatic aortic valve disorder, unspecified: Secondary | ICD-10-CM

## 2011-02-27 DIAGNOSIS — E119 Type 2 diabetes mellitus without complications: Secondary | ICD-10-CM

## 2011-02-27 DIAGNOSIS — I255 Ischemic cardiomyopathy: Secondary | ICD-10-CM

## 2011-02-27 DIAGNOSIS — I739 Peripheral vascular disease, unspecified: Secondary | ICD-10-CM

## 2011-02-27 DIAGNOSIS — I10 Essential (primary) hypertension: Secondary | ICD-10-CM

## 2011-02-27 DIAGNOSIS — I509 Heart failure, unspecified: Secondary | ICD-10-CM

## 2011-02-27 DIAGNOSIS — I350 Nonrheumatic aortic (valve) stenosis: Secondary | ICD-10-CM

## 2011-02-27 DIAGNOSIS — I2589 Other forms of chronic ischemic heart disease: Secondary | ICD-10-CM

## 2011-02-28 LAB — HEPATIC FUNCTION PANEL
AST: 9 U/L (ref 0–37)
Indirect Bilirubin: 0.3 mg/dL (ref 0.0–0.9)
Total Bilirubin: 0.4 mg/dL (ref 0.3–1.2)

## 2011-02-28 LAB — LIPID PANEL
Cholesterol: 161 mg/dL (ref 0–200)
Total CHOL/HDL Ratio: 6.4 Ratio

## 2011-02-28 LAB — HEMOGLOBIN A1C: Mean Plasma Glucose: 189 mg/dL — ABNORMAL HIGH (ref <117)

## 2011-03-01 ENCOUNTER — Encounter: Payer: Self-pay | Admitting: Internal Medicine

## 2011-03-02 ENCOUNTER — Other Ambulatory Visit: Payer: Self-pay | Admitting: Internal Medicine

## 2011-03-09 NOTE — Progress Notes (Signed)
  Subjective:    Patient ID: Dylan Jenkins, male    DOB: 05-06-1926, 77 y.o.   MRN: [id number]  HPI patient in today for six-month recheck. History of hypertension, diabetes, GE reflux, hyperlipidemia, coronary artery disease and osteoarthritis. Wife reports that he basically stays at home and watches TV most of the night and sleeps most of the day. He saw Dr. Ethelene Hal recently for long-term back pain. History of multilevel spinal stenosis. It is severe at L3-L4 but present also at L2-L3 and L4-L5. He is not thought to be a candidate for any further surgery. Epidural steroid injection done L5-S1 to the right in March 2012. He doesn't follow a very strict diabetic diet. He had coronary artery bypass graft surgery times 09/15/1998. History of depression and decreased motivation. Back in 2005, he went to cardiac rehabilitation twice a week. History of severe ischemic cardiomyopathy. Has a biventricular pacemaker defibrillator. History of congestive heart failure. History of peripheral vascular disease status post right lower extremity femoral-popliteal bypass graft surgery. Ejection fraction on echocardiogram estimated to be 35-40%. Ejection fraction was less than 25% in 2010. There is moderate aortic valve stenosis. Has been some discussion of whether or not he should remain on Relafen for his chronic hip and back pain in terms of his congestive heart failure however he has reported relief with it in the past. He is really not a candidate for any further orthopedic surgery. Handicap parking permit signed.    Review of Systems     Objective:   Physical Exam affect is a bit depressed and flat. Chest clear; cardiac exam regular rate and rhythm normal S1/S2, extremities trace lower extremity edema.        Assessment & Plan:   Hypertension  Diabetes mellitus  GE reflux  Hyperlipidemia  Coronary artery disease  Congestive heart failure  Peripheral vascular disease  Lumbar spinal  stenosis  Osteoarthritis  Depression  Plan: Continue with same medications. Has gotten some relief with Tylox one by mouth every 12 hours when necessary pain.

## 2011-04-27 ENCOUNTER — Ambulatory Visit (INDEPENDENT_AMBULATORY_CARE_PROVIDER_SITE_OTHER): Payer: Medicare Other | Admitting: "Endocrinology

## 2011-04-27 VITALS — BP 111/65 | HR 73 | Wt 201.0 lb

## 2011-04-27 DIAGNOSIS — E11649 Type 2 diabetes mellitus with hypoglycemia without coma: Secondary | ICD-10-CM

## 2011-04-27 DIAGNOSIS — R609 Edema, unspecified: Secondary | ICD-10-CM

## 2011-04-27 DIAGNOSIS — IMO0001 Reserved for inherently not codable concepts without codable children: Secondary | ICD-10-CM

## 2011-04-27 DIAGNOSIS — E669 Obesity, unspecified: Secondary | ICD-10-CM

## 2011-04-27 DIAGNOSIS — G8929 Other chronic pain: Secondary | ICD-10-CM

## 2011-04-27 DIAGNOSIS — G609 Hereditary and idiopathic neuropathy, unspecified: Secondary | ICD-10-CM

## 2011-04-27 DIAGNOSIS — R600 Localized edema: Secondary | ICD-10-CM

## 2011-04-27 DIAGNOSIS — E1159 Type 2 diabetes mellitus with other circulatory complications: Secondary | ICD-10-CM

## 2011-04-27 DIAGNOSIS — E1169 Type 2 diabetes mellitus with other specified complication: Secondary | ICD-10-CM

## 2011-04-27 DIAGNOSIS — I798 Other disorders of arteries, arterioles and capillaries in diseases classified elsewhere: Secondary | ICD-10-CM

## 2011-04-27 DIAGNOSIS — M549 Dorsalgia, unspecified: Secondary | ICD-10-CM

## 2011-04-27 DIAGNOSIS — I10 Essential (primary) hypertension: Secondary | ICD-10-CM

## 2011-04-27 DIAGNOSIS — E119 Type 2 diabetes mellitus without complications: Secondary | ICD-10-CM

## 2011-04-27 DIAGNOSIS — E1142 Type 2 diabetes mellitus with diabetic polyneuropathy: Secondary | ICD-10-CM

## 2011-04-27 DIAGNOSIS — E1149 Type 2 diabetes mellitus with other diabetic neurological complication: Secondary | ICD-10-CM

## 2011-04-27 DIAGNOSIS — F3289 Other specified depressive episodes: Secondary | ICD-10-CM

## 2011-04-27 DIAGNOSIS — F329 Major depressive disorder, single episode, unspecified: Secondary | ICD-10-CM

## 2011-04-27 DIAGNOSIS — E1151 Type 2 diabetes mellitus with diabetic peripheral angiopathy without gangrene: Secondary | ICD-10-CM

## 2011-04-27 LAB — GLUCOSE, POCT (MANUAL RESULT ENTRY): POC Glucose: 245

## 2011-04-27 NOTE — Patient Instructions (Signed)
Followup 3 months. These find a substitute for [place]. Keep up the good work overall

## 2011-05-13 LAB — GLUCOSE, CAPILLARY
Glucose-Capillary: 139 — ABNORMAL HIGH
Glucose-Capillary: 177 — ABNORMAL HIGH
Glucose-Capillary: 258 — ABNORMAL HIGH

## 2011-05-16 NOTE — Progress Notes (Signed)
CC: FU of T2DM, hypertension, peripheral neuropathy, angiopathy, hypoglycemia, goiter, depression  HPI: Dylan Jenkins is afeisty 77 y.o. white gentleman who was unaccompanied today.  1. Dylan Jenkins has had T2DM since about 1996-2001. He was referred to me in 2006 by his PCP, Dr. Sharlet Salina, MD, for assistance in managing his T2DM. As a man from "a good old [place] family" he initially had no real interest in adhering to a lower carb diet, but has definitely been watching what he eats better in the past year.  Because of his severe back problems he has not able to exercise much in the past. He also has had significant problems with peripheral neuropathy, leg and foot angiopathy, hypertension, ASHD, ASPVD with claudication, CHF, severe chronic back pains, and peripheral edema. He has a surgical history of coronary artery bypass graft in 2002, defibrillator placement in 2002, right leg arterio-venous graft iin 2003, and coronary stent in 2004. 2. In the interim his HbA1c values have ranged from 5.9 % to 8.3 % depending upon his willingness to cooperate with eating right and his adherence to medication regimens. We tried Byetta in 2007, but he was unable to tolerate that drug due to adverse GI effects. In the last year he has been on a regimen of Lantus insulin, 40 units at HS, Januvia, 100 mg each AM, and and glipizide, 20 mg, twice daily. His HbA1 values ranged from 7.0 % to 7.6 %.     3. His last [facility] visit was on 05.15.12. Since then he has been eating less pasta and bread lately in an effort to lose weight. Unfortunately, he has also increased his intake of [employer], which he did not know contains sugar. He [facility] recently and was started on oral morphine sulfate, 10 mg daily, PRN. The morphine has definitely helped him sleep better and eased his back pain substantially. He has also recently had pain and swelling in his right third finger DIP joint. He is not  sure what caused the swelling, but the swelling is now resolving. 4. PROS: Constitutional: Dylan Jenkins actually feels fairly well. He feels much better physically and emotionally after his 10-pound weight loss. Except for his back, he really did not have any other significant complaints. Even his back, however, is doing much better. He is able to move better with the morphine on board. He is spending more time up and around. He does his own shaving. He is now able to walk without a cane for short distances. Eyes: Vision is good. He had an eye exam approximately 2 months ago. There were no diabetes-related eye problems. Neck: The patient has no complaints of anterior neck swelling, soreness, tenderness,  pressure, discomfort, or difficulty swallowing.  Heart: The patient has no complaints of chest pain, chest pressure, palpitations, or other heart problems. Gastrointestinal: Bowel movents seem normal. The patient has no complaints of excessive hunger, acid reflux, upset stomach, stomach aches or pains, diarrhea, or constipation. Legs: There are no complaints of numbness, tingling, burning, or pain. He still has occasional edema, but nothing new or significant.  Feet: The previous right second toe lesion has now healed completely. His feet still frequently feel like he is walking on a balloon. No edema is noted. Hypoglycemia: No significant events recently.  PMFSH: 1. He remains active with his family.  ROS: Dylan Jenkins is not bothered significantly by other issues involving his other body systems.  PHYSICAL EXAM: BP 111/65  Pulse 73  Wt 201 lb (91.173 kg) This represents a decrease of 10 pounds since May. Constitutional: Dylan Jenkins is obese and walks with an antalgic gait, but is more erect and more comfortable walking. He really looked pretty good today. He is much brighter and happier. He was laughing and joking around. He even felt like talking about his experiences with the [employer]  and the [employer] in the [employer] during the [employer]. Mouth: The oropharynx appears normal. The tongue appears normal. There is normal oral moisture. There is no obvious gingivitis. Neck: There are no bruits present. The thyroid gland is normal in size today. The thyroid gland is approximately 20 grams in size. The consistency of the thyroid gland is fairly firm. There is no thyroid tenderness to palpation. Lungs: The lungs are clear. Air movement is good. Heart: The heart rhythm and rate appear normal. Heart sounds S1 and S2 are normal.He has a a grade 2-3/6 systolic ejection murmur.  Abdomen: The abdomen is enlarged. Bowell sounds are normal. The abdomen is soft and non-tender. There is no obviously palpable hepatomegaly, splenomegaly, or other masses.  Arms: Muscle mass appears appropriate for age.  Hands: There is no obvious tremor. He has a swollen right third DIP joint. Palms are normal. Legs: Muscle mass appears appropriate for age. There is 1+ pedal edema.  Feet: The previous toe lesion has healed. He had trace DP pulses bilaterally today.  Neurologic: Muscle strength is somewhat low for age and gender  in both the upper and the lower extremities. Muscle tone appears normal. Sensation to touch is normal in the legs, but severely decreased in the dorsa and heels of both feet.  Labs today: HbA1c is 7.7%.  ASSESSMENT:  1. T2DM: His glucose control is somewhat worse on this visit. Although he is working harder at diet, is trying to walk some,  and has had a significant decrease in weight, he has been overdoing the liquid sugar. I gave him several "no sugar" alternatives to [facility]. He is being very cooperative with his treatment plan. 2. Peripheral neuropathy: While he does have some anesthesia in his feet, his sensation is much better than it was several years ago. Moreover, he no longer has the painful aspect of his neuropathy. 3. Hypertension: He is doing well, in  part due to weight loss and in part due to walking more.  4. Angiopathy: There is some slight improvement today. He has fewer claudication symptoms today 5. Depression: He is doing much, much better. He really feels good about having less pain, about being able to get around a little better, and about losing weight. This is the most upbeat I've seen him in six years.  PLAN:   1. Diagnostic: No lab tests needed today. 2. Therapeutic: Continue his current diabetic medications. 3. Patient education: Keep up the good work with eating right and trying to walk more. Discontinue the [employer].  4. Follow-up: four months.  Level of Service: This visit lasted in excess of 40 minutes. More than 50% of the visit was devoted to counseling.

## 2011-07-20 ENCOUNTER — Other Ambulatory Visit: Payer: Self-pay

## 2011-08-10 ENCOUNTER — Telehealth: Payer: Self-pay | Admitting: *Deleted

## 2011-08-10 NOTE — Telephone Encounter (Signed)
See below note.

## 2011-08-13 ENCOUNTER — Ambulatory Visit: Payer: Medicare Other | Admitting: "Endocrinology

## 2011-08-28 ENCOUNTER — Encounter: Payer: Self-pay | Admitting: Internal Medicine

## 2011-09-03 ENCOUNTER — Other Ambulatory Visit: Payer: Medicare Other | Admitting: Internal Medicine

## 2011-09-04 ENCOUNTER — Ambulatory Visit (INDEPENDENT_AMBULATORY_CARE_PROVIDER_SITE_OTHER): Payer: Medicare Other | Admitting: Internal Medicine

## 2011-09-04 ENCOUNTER — Other Ambulatory Visit: Payer: Medicare Other | Admitting: Internal Medicine

## 2011-09-04 ENCOUNTER — Other Ambulatory Visit: Payer: Self-pay | Admitting: Internal Medicine

## 2011-09-04 ENCOUNTER — Encounter: Payer: Medicare Other | Admitting: Internal Medicine

## 2011-09-04 DIAGNOSIS — E785 Hyperlipidemia, unspecified: Secondary | ICD-10-CM

## 2011-09-04 DIAGNOSIS — F32A Depression, unspecified: Secondary | ICD-10-CM

## 2011-09-04 DIAGNOSIS — I350 Nonrheumatic aortic (valve) stenosis: Secondary | ICD-10-CM

## 2011-09-04 DIAGNOSIS — M4800 Spinal stenosis, site unspecified: Secondary | ICD-10-CM

## 2011-09-04 DIAGNOSIS — F329 Major depressive disorder, single episode, unspecified: Secondary | ICD-10-CM

## 2011-09-04 DIAGNOSIS — E7800 Pure hypercholesterolemia, unspecified: Secondary | ICD-10-CM

## 2011-09-04 DIAGNOSIS — I10 Essential (primary) hypertension: Secondary | ICD-10-CM

## 2011-09-04 DIAGNOSIS — I739 Peripheral vascular disease, unspecified: Secondary | ICD-10-CM

## 2011-09-04 DIAGNOSIS — Z125 Encounter for screening for malignant neoplasm of prostate: Secondary | ICD-10-CM

## 2011-09-04 DIAGNOSIS — N183 Chronic kidney disease, stage 3 unspecified: Secondary | ICD-10-CM

## 2011-09-04 DIAGNOSIS — I509 Heart failure, unspecified: Secondary | ICD-10-CM

## 2011-09-04 DIAGNOSIS — M1990 Unspecified osteoarthritis, unspecified site: Secondary | ICD-10-CM

## 2011-09-04 DIAGNOSIS — Z0000 Encounter for general adult medical examination without abnormal findings: Secondary | ICD-10-CM

## 2011-09-04 DIAGNOSIS — D649 Anemia, unspecified: Secondary | ICD-10-CM

## 2011-09-05 ENCOUNTER — Encounter: Payer: Self-pay | Admitting: Internal Medicine

## 2011-09-05 DIAGNOSIS — M4800 Spinal stenosis, site unspecified: Secondary | ICD-10-CM | POA: Insufficient documentation

## 2011-09-05 DIAGNOSIS — I350 Nonrheumatic aortic (valve) stenosis: Secondary | ICD-10-CM | POA: Insufficient documentation

## 2011-09-05 DIAGNOSIS — D649 Anemia, unspecified: Secondary | ICD-10-CM | POA: Insufficient documentation

## 2011-09-05 DIAGNOSIS — I739 Peripheral vascular disease, unspecified: Secondary | ICD-10-CM | POA: Insufficient documentation

## 2011-09-05 LAB — CBC WITH DIFFERENTIAL/PLATELET
Eosinophils Relative: 6 % — ABNORMAL HIGH (ref 0–5)
HCT: 37.6 % — ABNORMAL LOW (ref 39.0–52.0)
Lymphocytes Relative: 30 % (ref 12–46)
MCV: 91.3 fL (ref 78.0–100.0)
Monocytes Relative: 7 % (ref 3–12)
Neutro Abs: 4.6 10*3/uL (ref 1.7–7.7)
Platelets: 224 10*3/uL (ref 150–400)
RBC: 4.12 MIL/uL — ABNORMAL LOW (ref 4.22–5.81)
RDW: 14.5 % (ref 11.5–15.5)

## 2011-09-05 LAB — COMPREHENSIVE METABOLIC PANEL
ALT: 9 U/L (ref 0–53)
BUN: 22 mg/dL (ref 6–23)
CO2: 31 mEq/L (ref 19–32)
Creat: 1.59 mg/dL — ABNORMAL HIGH (ref 0.50–1.35)
Sodium: 141 mEq/L (ref 135–145)

## 2011-09-05 LAB — LIPID PANEL
Cholesterol: 174 mg/dL (ref 0–200)
LDL Cholesterol: 111 mg/dL — ABNORMAL HIGH (ref 0–99)
Triglycerides: 162 mg/dL — ABNORMAL HIGH (ref <150)
VLDL: 32 mg/dL (ref 0–40)

## 2011-09-05 LAB — HEMOGLOBIN A1C
Hgb A1c MFr Bld: 7.5 % — ABNORMAL HIGH (ref <5.7)
Mean Plasma Glucose: 169 mg/dL — ABNORMAL HIGH (ref <117)

## 2011-09-05 LAB — PSA: PSA: 1.07 ng/mL (ref <=4.00)

## 2011-09-05 NOTE — Patient Instructions (Signed)
Continue same medications. Return in 6 months for fasting lipid panel, liver functions, hemoglobin A1c and office visit. Continue to be followed by cardiologist. Try to be a bit more active. Watch Accu-Chek.

## 2011-09-05 NOTE — Progress Notes (Signed)
Subjective:    Patient ID: Dylan Jenkins, male    DOB: 1925-12-02, 78 y.o.   MRN: [id number]  HPI 78 year old white male with long-standing history of hypertension and diabetes mellitus. History of hyperlipidemia. History of coronary artery disease, osteoarthritis, spinal stenosis. Patient had coronary artery bypass surgery times 09/15/1998. Had cataract extraction left eye in 1994, bone spurs removed from heels 1971. Had Zostavax vaccine January 2012. History of pneumonia with congestive heart failure February 2000. History of herpes zoster July 2005. Patient followed by [facility], Dr. Fransico Michael for diabetes, Dr. rhinitis has seen him for epidural steroid injection L5-S1 spring 2012. He says it really didn't help.  [facility] for fever of unknown origin. Bone marrow biopsy was normal. 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 Has implantable defibrillator. History of ischemic cardiomyopathy and nonsustained ventricular tachycardia. History of peripheral vascular disease. Saw Dr. Hart Rochester in 2003 and had surgery August 2003. This was a right TM to above-the-knee popliteal bypass graft. History of coronary ejection fraction of about 30% with left ventricular dysfunction of moderate degree. Also has a pacemaker. Takes gabapentin and Relafen for chronic pain. History of depression related to his health and also surrounding son who is an alcoholic. Doesn't have much energy and basically spends a lot of his time in bed watching TV according to wife. We tried sending him to cardiac rehabilitation for while but he doesn't go anymore. Diabetes right now is under good control with Dr. Juluis Mire help. At one point it was poorly controlled. Patient gets tired very easily. Complains of thigh and hip pain as well as back pain. He is considering getting another epidural steroid injection. Has a handicap parking permit.  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 He is married. He and his wife live alone.  Father died at age 24 of arteriosclerotic cardiovascular disease. Mother died at 37 with complications of hypertension. One brother and 3 sisters. 4 adult children. Father with history of stroke. Patient quit smoking in 1987 after smoking 2 packs per day for some 50 years. Does not consume alcohol. Patient weighed 234 pounds in 1997.  History of aortic stenosis mild to moderate.  Patient had echocardiogram January 2012 by cardiologist.    Review of Systems  Constitutional: Positive for fatigue.  HENT: Negative.   Eyes: Negative.   Respiratory: Positive for shortness of breath.   Cardiovascular: Negative for palpitations.  Gastrointestinal: Negative.   Genitourinary: Negative.   Musculoskeletal: Positive for back pain and arthralgias.  Skin: Negative.   Neurological: Negative.   Hematological: Negative.   Psychiatric/Behavioral: Positive for dysphoric mood.       Objective:   Physical Exam  Vitals reviewed. Constitutional: He is oriented to person, place, and time. He appears well-nourished.  HENT:  Head: Normocephalic and atraumatic.  Right Ear: External ear normal.  Left Ear: External ear normal.  Nose: Nose normal.  Mouth/Throat: Oropharynx is clear and moist. No oropharyngeal exudate.  Eyes: EOM are normal. Pupils are equal, round, and reactive to light. Right eye exhibits no discharge. Left eye exhibits no discharge. No scleral icterus.  Neck: Normal range of motion. Neck supple. No JVD present. No thyromegaly present.  Cardiovascular: Normal rate, regular rhythm and intact distal pulses.   Murmur heard.      3/6 systolic ejection murmur, no bruits  Pulmonary/Chest: Effort normal and breath  sounds normal. No respiratory distress. He has no wheezes. He has no rales.  Abdominal: Soft. Bowel sounds are normal. He exhibits no distension  and no mass. There is no tenderness. There is no rebound and no guarding.  Genitourinary:       Declines prostate exam  Musculoskeletal: He exhibits no edema.  Lymphadenopathy:    He has no cervical adenopathy.  Neurological: He is alert and oriented to person, place, and time. He has normal reflexes. No cranial nerve deficit. Coordination normal.  Skin: Skin is warm and dry. No rash noted. He is not diaphoretic.  Psychiatric: He has a normal mood and affect. His behavior is normal. Thought content normal.       Chronic depression-no change          Assessment & Plan:  Hypertension  Hyperlipidemia  Adult onset diabetes mellitus  Coronary artery disease  History of ventricular tachycardia status post implantable defibrillator and pacemaker  Congestive heart failure  Aortic stenosis  Osteoarthritis  Spinal stenosis  Depression  History of peripheral vascular disease  Plan: Patient reminded about annual eye exam. History turn in 6 months for fasting lipid panel liver functions and hemoglobin A1c. Told him it was okay to have second epidural steroid injection for spinal stenosis. Okay to refill medications for 6 months.    Patient has been found to have mild anemia. Will need to have anemia studies drawn. He will be given 3 Hemoccult cards.

## 2011-09-07 LAB — IRON AND TIBC
%SAT: 23 % (ref 20–55)
Iron: 59 ug/dL (ref 42–165)
UIBC: 195 ug/dL (ref 125–400)

## 2011-09-15 ENCOUNTER — Other Ambulatory Visit: Payer: Medicare Other | Admitting: Internal Medicine

## 2011-09-15 DIAGNOSIS — D649 Anemia, unspecified: Secondary | ICD-10-CM

## 2011-09-15 LAB — RETICULOCYTES: RBC.: 4.2 MIL/uL — ABNORMAL LOW (ref 4.22–5.81)

## 2011-09-22 ENCOUNTER — Telehealth: Payer: Self-pay | Admitting: Internal Medicine

## 2011-09-22 NOTE — Telephone Encounter (Signed)
08-28-11 sent pas due letter/mt 09-22-11 lm with pt's wife to have pt call to set up defib ck with [facility], was due December/mt

## 2011-09-28 ENCOUNTER — Encounter: Payer: Self-pay | Admitting: Internal Medicine

## 2011-09-28 ENCOUNTER — Ambulatory Visit (INDEPENDENT_AMBULATORY_CARE_PROVIDER_SITE_OTHER): Payer: Medicare Other | Admitting: *Deleted

## 2011-09-28 DIAGNOSIS — I5022 Chronic systolic (congestive) heart failure: Secondary | ICD-10-CM

## 2011-09-28 DIAGNOSIS — Z95810 Presence of automatic (implantable) cardiac defibrillator: Secondary | ICD-10-CM

## 2011-10-01 LAB — REMOTE ICD DEVICE
AL IMPEDENCE ICD: 536 Ohm
BAMS-0001: 170 {beats}/min
BATTERY VOLTAGE: 3 V
LV LEAD IMPEDENCE ICD: 300 Ohm
LV LEAD THRESHOLD: 2 V
PACEART VT: 0
TOT-0002: 0
TOT-0006: 20091110000000
TZAT-0001ATACH: 1
TZAT-0002ATACH: NEGATIVE
TZAT-0002ATACH: NEGATIVE
TZAT-0005SLOWVT: 91 pct
TZAT-0011SLOWVT: 10 ms
TZAT-0011SLOWVT: 10 ms
TZAT-0012ATACH: 150 ms
TZAT-0012SLOWVT: 200 ms
TZAT-0012SLOWVT: 200 ms
TZAT-0018SLOWVT: NEGATIVE
TZAT-0019ATACH: 6 V
TZAT-0019ATACH: 6 V
TZAT-0019SLOWVT: 8 V
TZAT-0019SLOWVT: 8 V
TZAT-0020ATACH: 1.5 ms
TZAT-0020ATACH: 1.5 ms
TZAT-0020FASTVT: 1.5 ms
TZON-0003ATACH: 350 ms
TZON-0003SLOWVT: 340 ms
TZON-0005SLOWVT: 12
TZST-0001ATACH: 4
TZST-0001ATACH: 5
TZST-0001FASTVT: 2
TZST-0001FASTVT: 3
TZST-0001SLOWVT: 4
TZST-0001SLOWVT: 6
TZST-0002ATACH: NEGATIVE
TZST-0002FASTVT: NEGATIVE
TZST-0003SLOWVT: 25 J
TZST-0003SLOWVT: 35 J

## 2011-10-05 ENCOUNTER — Ambulatory Visit (INDEPENDENT_AMBULATORY_CARE_PROVIDER_SITE_OTHER): Payer: Medicare Other | Admitting: "Endocrinology

## 2011-10-05 ENCOUNTER — Encounter: Payer: Self-pay | Admitting: "Endocrinology

## 2011-10-05 VITALS — BP 136/68 | HR 73 | Wt 208.0 lb

## 2011-10-05 DIAGNOSIS — I798 Other disorders of arteries, arterioles and capillaries in diseases classified elsewhere: Secondary | ICD-10-CM

## 2011-10-05 DIAGNOSIS — IMO0001 Reserved for inherently not codable concepts without codable children: Secondary | ICD-10-CM

## 2011-10-05 DIAGNOSIS — E1142 Type 2 diabetes mellitus with diabetic polyneuropathy: Secondary | ICD-10-CM

## 2011-10-05 DIAGNOSIS — E1159 Type 2 diabetes mellitus with other circulatory complications: Secondary | ICD-10-CM

## 2011-10-05 DIAGNOSIS — F329 Major depressive disorder, single episode, unspecified: Secondary | ICD-10-CM

## 2011-10-05 DIAGNOSIS — E669 Obesity, unspecified: Secondary | ICD-10-CM

## 2011-10-05 DIAGNOSIS — G609 Hereditary and idiopathic neuropathy, unspecified: Secondary | ICD-10-CM

## 2011-10-05 DIAGNOSIS — E1151 Type 2 diabetes mellitus with diabetic peripheral angiopathy without gangrene: Secondary | ICD-10-CM

## 2011-10-05 DIAGNOSIS — I10 Essential (primary) hypertension: Secondary | ICD-10-CM

## 2011-10-05 DIAGNOSIS — E1149 Type 2 diabetes mellitus with other diabetic neurological complication: Secondary | ICD-10-CM

## 2011-10-05 DIAGNOSIS — F3289 Other specified depressive episodes: Secondary | ICD-10-CM

## 2011-10-05 LAB — POCT GLYCOSYLATED HEMOGLOBIN (HGB A1C): Hemoglobin A1C: 7.2

## 2011-10-05 LAB — GLUCOSE, POCT (MANUAL RESULT ENTRY): POC Glucose: 165

## 2011-10-05 NOTE — Patient Instructions (Signed)
Next visit in 4 months. Please try to walk every day. Please try to find a sugar-free alternative to [facility].

## 2011-10-05 NOTE — Progress Notes (Signed)
CC: FU of T2DM, hypertension, peripheral neuropathy, angiopathy, hypoglycemia, goiter, depression  HPI: Mr. Eliot is a feisty 78 y.o. white gentleman who was unaccompanied today.   1. Mr. Duffner has had T2DM since about 1996-2001. He was referred to me in 2006 by his PCP, Dr. Sharlet Salina, MD, for assistance in managing his T2DM. As a man from "a good old [place] family" he initially had no real interest in adhering to a lower carb diet, but has definitely been watching what he eats better in the past year.  Because of his severe back problems he has not able to exercise much in the past. He also has had significant problems with peripheral neuropathy, leg and foot angiopathy, hypertension, ASHD, ASPVD with claudication, CHF, severe chronic back pains, and peripheral edema. He has a surgical history of coronary artery bypass graft in 2002, defibrillator placement in 2002, right leg arterio-venous graft iin 2003, and coronary stent in 2004. All of his physical problems contributed to and were exacerbated by his depression. 2. During the past six years his HbA1c values have ranged from 5.9-8.3 % depending upon his willingness to cooperate with eating right and his adherence to medication regimens. We tried Byetta in 2007, but he was unable to tolerate that drug due to adverse GI effects. In the last year he has been on a regimen of Lantus insulin, 40 units at HS, Januvia, 100 mg each AM, and and glipizide, 20 mg, twice daily. His HbA1 values ranged from 7.0 % to 7.6 %.     3. His last [facility] visit was on [id number]. Since then he has been having more problems with pain and numbness at times in his left fingers and left lateral ankle. [facility] and was started on oral morphine sulfate, 10 mg daily, PRN. His pain has improved to the point that he is not using the morphine much at all.  4. Pertinent Review of Systems:: Constitutional: Mr. Donahue feels "not bad,  really". His back pain does not bother him much any more. He feels much better physically and emotionally. "I sleep well. I eat well."  He does his own shaving. He is able to walk without a cane for short distances. He is now limited more by claudication symptoms than by back pain, not because the claudication symptoms are worse, but because the back pain is better.   Eyes: Vision is good. He had an eye exam approximately 7 months ago. There were no diabetes-related eye problems. Neck: The patient has no complaints of anterior neck swelling, soreness, tenderness,  pressure, discomfort, or difficulty swallowing.  Heart: The patient has no complaints of chest pain, chest pressure, palpitations, or other heart problems. Gastrointestinal: Bowel movents seem normal. The patient has no complaints of excessive hunger, acid reflux, upset stomach, stomach aches or pains, diarrhea, or constipation. Legs: There are no complaints of numbness, tingling, burning, or new pains. He still has occasional knee pains after prolonged sittting, but nothing new or significant. No further edema. Feet:  No symptoms except episodic pains in the lateral left ankle. No edema is noted. Hypoglycemia: No significant events recently.  PAST MEDICAL, FAMILY, AND SOCIAL HISTORY:  1. Family:He remains active with his family. 2. Activities: He goes to a lot of [employer] sporting events. He and his wife eat out a lot. 3. Alcohol, tobacco, illicit drugs: none 4. Primary care provider: Dr. Eden Emms Baxley  ROS: Mr. Brearley is  not bothered significantly by other issues involving his other body systems.  PHYSICAL EXAM: BP 136/68  Pulse 73  Wt 208 lb (94.348 kg) This represents a gain of 7 pounds since September. Constitutional: Mr. Coble is obese and walks with an antalgic gait, but is also more erect and more comfortable walking. He really looked pretty good today. His affect and mental status were very good. He is much  brighter and happier. He was laughing and joking around.  Eyes: Slight superior arcus bilaterally. No proptosis. Mouth: The oropharynx appears normal. The tongue appears normal. There is normal oral moisture. There is no obvious gingivitis. Neck: There are no bruits present. The thyroid gland is low-lying and is normal in size today. The consistency of the thyroid gland is normal. There is no thyroid tenderness to palpation. Lungs: The lungs are clear. Air movement is good. Heart: The heart rhythm and rate appear normal. Heart sounds S1 and S2 are normal. He still has a a grade 2-3/6 systolic ejection murmur.  Abdomen: The abdomen is enlarged. Bowell sounds are normal. The abdomen is soft and non-tender. There is no obviously palpable hepatomegaly, splenomegaly, or other masses.  Arms: Muscle mass appears appropriate for age.  Hands: There is no obvious tremor. Palms are normal. Legs: Muscle mass appears appropriate for age. His ability to use his hip flexors to lift his legs while sitting is much improved compared to 1-2 years ago.There is no shin edema.  Feet:  He had trace DP pulses bilaterally today. He also had 1+ ankle edema and 2+ tinea pedis. Neurologic: Muscle strength is somewhat low for age and gender in both the upper and the lower extremities. Muscle tone appears normal. Sensation to touch is normal in the legs, but severely decreased in the dorsa and heels of both feet.  Labs today: HbA1c is 7.2%. This is a decrease from 7/7% in September.  ASSESSMENT:  1. T2DM: His glucose control is better, despite some weight gain. He is being very cooperative with his treatment plan. 2. Peripheral neuropathy: While he does have some anesthesia in his feet, his sensation is much better than it was several years ago. Moreover, he no longer has the painful aspect of his neuropathy. 3. Hypertension: He is doing well, in part due to prior weight loss and in part due to walking somewhat.  4.  Angiopathy: There is no real change since last visit. He has claudication symptoms when he walks very far.  5. Depression: He is doing much, much better. He really feels good about having less pain, about being able to get around a little better, and about losing weight. He really looks great today. He really enjoys his improved mobility. 6. Obesity: This is worse again. He loves his Media planner.  PLAN:   1. Diagnostic: No lab tests needed today. Obtain recent lab results from Dr. Lenord Fellers. 2. Therapeutic: Continue his current diabetic medications. 3. Patient education: Keep up the good work with eating right and trying to walk more. Try to find a sugar-free alternative to [facility].  4. Follow-up: four months.  Level of Service: This visit lasted in excess of 40 minutes. More than 50% of the visit was devoted to counseling.  David Stall

## 2011-10-05 NOTE — Progress Notes (Signed)
ICD remote with ICM 

## 2011-10-07 ENCOUNTER — Encounter: Payer: Self-pay | Admitting: *Deleted

## 2011-10-07 ENCOUNTER — Encounter (INDEPENDENT_AMBULATORY_CARE_PROVIDER_SITE_OTHER): Payer: Medicare Other | Admitting: *Deleted

## 2011-10-07 DIAGNOSIS — I70219 Atherosclerosis of native arteries of extremities with intermittent claudication, unspecified extremity: Secondary | ICD-10-CM

## 2011-10-07 DIAGNOSIS — Z48812 Encounter for surgical aftercare following surgery on the circulatory system: Secondary | ICD-10-CM

## 2011-10-14 ENCOUNTER — Other Ambulatory Visit: Payer: Self-pay | Admitting: *Deleted

## 2011-10-14 ENCOUNTER — Encounter: Payer: Self-pay | Admitting: Vascular Surgery

## 2011-10-14 DIAGNOSIS — I70219 Atherosclerosis of native arteries of extremities with intermittent claudication, unspecified extremity: Secondary | ICD-10-CM

## 2011-10-14 DIAGNOSIS — Z48812 Encounter for surgical aftercare following surgery on the circulatory system: Secondary | ICD-10-CM

## 2011-10-14 NOTE — Procedures (Unsigned)
BYPASS GRAFT EVALUATION  INDICATION:  Right lower extremity bypass graft  HISTORY: Diabetes:  Yes Cardiac:  No Hypertension:  Yes Smoking:  Previous Previous Surgery:  Right fem-pop bypass graft on 03/30/2002  SINGLE LEVEL ARTERIAL EXAM                              RIGHT              LEFT Brachial: Anterior tibial: Posterior tibial: Peroneal: Ankle/brachial index:  PREVIOUS ABI:  Date:  RIGHT:  LEFT:  LOWER EXTREMITY BYPASS GRAFT DUPLEX EXAM:  DUPLEX:  Nonphasic Doppler waveforms noted throughout the right lower extremity bypass graft with velocity of 201 cm/sec noted in the right common femoral artery.  IMPRESSION: 1. Patent right fem-pop bypass graft with no evidence of internal     stenosis. 2. Bilateral ankle-brachial indices are noted on a separate report.  ___________________________________________ Quita Skye. Hart Rochester, M.D.  CH/MEDQ  D:  10/08/2011  T:  10/08/2011  Job:  [id number]

## 2011-11-24 ENCOUNTER — Encounter: Payer: Medicare Other | Admitting: Internal Medicine

## 2012-01-08 ENCOUNTER — Emergency Department: Payer: Self-pay | Admitting: Emergency Medicine

## 2012-01-08 LAB — URINALYSIS, COMPLETE
Bilirubin,UR: NEGATIVE
Blood: NEGATIVE
Glucose,UR: 50 mg/dL (ref 0–75)
Granular Cast: 6
Leukocyte Esterase: NEGATIVE
Ph: 5 (ref 4.5–8.0)
Protein: 30
Specific Gravity: 1.017 (ref 1.003–1.030)
WBC UR: 3 /HPF (ref 0–5)
White Blood Cell Cast: 1

## 2012-01-08 LAB — CBC
HGB: 12.9 g/dL — ABNORMAL LOW (ref 13.0–18.0)
MCH: 28.6 pg (ref 26.0–34.0)
MCV: 88 fL (ref 80–100)
RDW: 13.6 % (ref 11.5–14.5)
WBC: 8 10*3/uL (ref 3.8–10.6)

## 2012-01-08 LAB — COMPREHENSIVE METABOLIC PANEL
Albumin: 3.3 g/dL — ABNORMAL LOW (ref 3.4–5.0)
Alkaline Phosphatase: 89 U/L (ref 50–136)
Calcium, Total: 8.6 mg/dL (ref 8.5–10.1)
Chloride: 101 mmol/L (ref 98–107)
Creatinine: 1.57 mg/dL — ABNORMAL HIGH (ref 0.60–1.30)
EGFR (African American): 47 — ABNORMAL LOW
Osmolality: 284 (ref 275–301)
Potassium: 4.2 mmol/L (ref 3.5–5.1)
SGOT(AST): 18 U/L (ref 15–37)

## 2012-01-08 LAB — TROPONIN I: Troponin-I: <0.02 ng/mL

## 2012-01-09 ENCOUNTER — Inpatient Hospital Stay: Payer: Self-pay | Admitting: Internal Medicine

## 2012-01-09 ENCOUNTER — Telehealth: Payer: Self-pay | Admitting: Internal Medicine

## 2012-01-09 LAB — COMPREHENSIVE METABOLIC PANEL
Albumin: 3.2 g/dL — ABNORMAL LOW (ref 3.4–5.0)
Co2: 28 mmol/L (ref 21–32)
Creatinine: 1.56 mg/dL — ABNORMAL HIGH (ref 0.60–1.30)
EGFR (African American): 47 — ABNORMAL LOW
Glucose: 273 mg/dL — ABNORMAL HIGH (ref 65–99)
Potassium: 4.3 mmol/L (ref 3.5–5.1)
SGOT(AST): 14 U/L — ABNORMAL LOW (ref 15–37)
SGPT (ALT): 17 U/L
Sodium: 137 mmol/L (ref 136–145)
Total Protein: 7.3 g/dL (ref 6.4–8.2)

## 2012-01-09 LAB — CBC WITH DIFFERENTIAL/PLATELET
Basophil %: 1 %
Eosinophil #: 0.2 10*3/uL (ref 0.0–0.7)
Eosinophil %: 2.9 %
HCT: 38.3 % — ABNORMAL LOW (ref 40.0–52.0)
HGB: 12.5 g/dL — ABNORMAL LOW (ref 13.0–18.0)
Lymphocyte #: 1.5 10*3/uL (ref 1.0–3.6)
MCH: 28.7 pg (ref 26.0–34.0)
MCV: 88 fL (ref 80–100)
Monocyte %: 6.2 %
Neutrophil #: 6.2 10*3/uL (ref 1.4–6.5)
Neutrophil %: 72.7 %
Platelet: 229 10*3/uL (ref 150–440)
RBC: 4.34 10*6/uL — ABNORMAL LOW (ref 4.40–5.90)
RDW: 13.5 % (ref 11.5–14.5)
WBC: 8.5 10*3/uL (ref 3.8–10.6)

## 2012-01-09 LAB — TROPONIN I: Troponin-I: <0.02 ng/mL

## 2012-01-09 LAB — URINALYSIS, COMPLETE
Bacteria: NONE SEEN
Glucose,UR: 50 mg/dL (ref 0–75)
Ketone: NEGATIVE
Leukocyte Esterase: NEGATIVE
RBC,UR: 2 /HPF (ref 0–5)
Squamous Epithelial: NONE SEEN
WBC UR: 2 /HPF (ref 0–5)

## 2012-01-09 LAB — HEMOGLOBIN A1C: Hemoglobin A1C: 8.7 % — ABNORMAL HIGH (ref 4.2–6.3)

## 2012-01-09 LAB — TSH: Thyroid Stimulating Horm: 0.633 u[IU]/mL

## 2012-01-09 NOTE — Telephone Encounter (Signed)
Mr. Dylan Jenkins and his wife have recently gone to live in an assisted living facility in [place]. Daughter Dylan Jenkins, visiting from [place] came to see the him and found him in a bit of feces. He had an abscessed tooth. His wife who has early dementia was not able to give him the correct medications. He apparently had not received his insulin for about a week. Had decreased by mouth intake. She took him to [facility] in [place] and he was evaluated there yesterday and given IV fluids but not admitted. Patient is disturbed that today he has not much better. He still had no by mouth intake over the past 24 hours. She is leaving to go to [place] to a conference and will be flying back to [place] tomorrow. She is disappointed in the care at the assisted living facility. Sounds like they need more care than that at the present time. 20 minute discussion about what to do with him at the present time. Suggested that she take him back to the emergency department and explained that he is no better today than yesterday with still no by mouth intake. She and her siblings need to decide where they need to place their parents. She was not even aware of that [name] has dementia. Explained to her that her parents were both recently counseled by psychiatrist regarding testing of [name] and that she was thought to have early dementia based on sophisticated testing. Obviously this is a placement problem once his medical needs are addressed. She also tells me that Ms. Radin was drinking wine in the house and hiding it.

## 2012-01-10 DIAGNOSIS — I059 Rheumatic mitral valve disease, unspecified: Secondary | ICD-10-CM

## 2012-01-10 LAB — COMPREHENSIVE METABOLIC PANEL
Albumin: 2.9 g/dL — ABNORMAL LOW (ref 3.4–5.0)
Alkaline Phosphatase: 83 U/L (ref 50–136)
Anion Gap: 11 (ref 7–16)
BUN: 21 mg/dL — ABNORMAL HIGH (ref 7–18)
Bilirubin,Total: 0.7 mg/dL (ref 0.2–1.0)
Calcium, Total: 8.3 mg/dL — ABNORMAL LOW (ref 8.5–10.1)
Chloride: 106 mmol/L (ref 98–107)
Co2: 24 mmol/L (ref 21–32)
Creatinine: 1.29 mg/dL (ref 0.60–1.30)
EGFR (African American): 59 — ABNORMAL LOW
EGFR (Non-African Amer.): 51 — ABNORMAL LOW
Glucose: 156 mg/dL — ABNORMAL HIGH (ref 65–99)
Osmolality: 287 (ref 275–301)
Potassium: 3.9 mmol/L (ref 3.5–5.1)
SGOT(AST): 15 U/L (ref 15–37)
SGPT (ALT): 16 U/L
Sodium: 141 mmol/L (ref 136–145)
Total Protein: 6.7 g/dL (ref 6.4–8.2)

## 2012-01-10 LAB — CK-MB
CK-MB: 1.3 ng/mL (ref 0.5–3.6)
CK-MB: 0.8 ng/mL (ref 0.5–3.6)

## 2012-01-10 LAB — CBC WITH DIFFERENTIAL/PLATELET
Basophil #: 0.1 10*3/uL (ref 0.0–0.1)
Eosinophil #: 0.3 10*3/uL (ref 0.0–0.7)
Eosinophil %: 4.2 %
HGB: 11.4 g/dL — ABNORMAL LOW (ref 13.0–18.0)
Lymphocyte %: 28 %
MCHC: 32.9 g/dL (ref 32.0–36.0)
Monocyte #: 0.5 x10 3/mm (ref 0.2–1.0)
Monocyte %: 7.1 %
Neutrophil %: 59.7 %
RBC: 3.98 10*6/uL — ABNORMAL LOW (ref 4.40–5.90)
RDW: 13.8 % (ref 11.5–14.5)
WBC: 7.7 10*3/uL (ref 3.8–10.6)

## 2012-01-10 LAB — TROPONIN I: Troponin-I: <0.02 ng/mL

## 2012-01-11 LAB — BASIC METABOLIC PANEL
BUN: 18 mg/dL (ref 7–18)
Chloride: 106 mmol/L (ref 98–107)
Co2: 24 mmol/L (ref 21–32)
EGFR (African American): 60 — ABNORMAL LOW
EGFR (Non-African Amer.): 51 — ABNORMAL LOW
Glucose: 138 mg/dL — ABNORMAL HIGH (ref 65–99)
Osmolality: 282 (ref 275–301)

## 2012-01-11 LAB — PSA: PSA: 0.9 ng/mL (ref 0.0–4.0)

## 2012-01-22 ENCOUNTER — Encounter: Payer: Self-pay | Admitting: *Deleted

## 2012-02-03 ENCOUNTER — Ambulatory Visit (INDEPENDENT_AMBULATORY_CARE_PROVIDER_SITE_OTHER): Payer: Medicare Other | Admitting: "Endocrinology

## 2012-02-03 ENCOUNTER — Encounter: Payer: Self-pay | Admitting: "Endocrinology

## 2012-02-03 ENCOUNTER — Ambulatory Visit: Payer: Medicare Other | Admitting: "Endocrinology

## 2012-02-03 VITALS — BP 102/46 | HR 75 | Wt 182.3 lb

## 2012-02-03 DIAGNOSIS — E1149 Type 2 diabetes mellitus with other diabetic neurological complication: Secondary | ICD-10-CM

## 2012-02-03 DIAGNOSIS — E1142 Type 2 diabetes mellitus with diabetic polyneuropathy: Secondary | ICD-10-CM

## 2012-02-03 DIAGNOSIS — E669 Obesity, unspecified: Secondary | ICD-10-CM

## 2012-02-03 DIAGNOSIS — I739 Peripheral vascular disease, unspecified: Secondary | ICD-10-CM

## 2012-02-03 DIAGNOSIS — F0390 Unspecified dementia without behavioral disturbance: Secondary | ICD-10-CM | POA: Insufficient documentation

## 2012-02-03 DIAGNOSIS — I10 Essential (primary) hypertension: Secondary | ICD-10-CM

## 2012-02-03 DIAGNOSIS — G909 Disorder of the autonomic nervous system, unspecified: Secondary | ICD-10-CM

## 2012-02-03 LAB — GLUCOSE, POCT (MANUAL RESULT ENTRY): POC Glucose: 180 mg/dl — AB (ref 70–99)
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
## 2012-02-03 NOTE — Patient Instructions (Signed)
Follow-up visit in three months. Please call me on Wednesday, July 3rd. Stop insulins. Continue glipizide XL, 5 mg/day. Add Januvia, 50 mg/day.

## 2012-02-10 ENCOUNTER — Telehealth: Payer: Self-pay | Admitting: "Endocrinology

## 2012-02-10 NOTE — Telephone Encounter (Signed)
Daughter, Dylan Jenkins, called to give me a progress report.   Subjective: 1. Home health nurses will begin seeing him and Mrs. Noblet on 02/12/12. He will also have visits from PT and OT. Once the support system is fully in place, she will return to her home in [place]. The other family members in [place] will then supervise his care, but probably less intensely than she has been doing.  2. He is fairly depressed. Although he is not eating a lot, he wants to eat what he wants, when he wants. He has no interest in checking BGs, but will do so when coaxed. He will take his medications when coaxed.  3. He is currently taking glipizide XL, 5 mg, once daily and Januvia, 50 mg, once daily.  4. AM BGs have been in the 180s. PM BGs have been in the low 200s.   Assessment: 1. The BGs are actually better than I had expected.  2. We will try to develop a treatment plan that will meet the following criteria:  A. Reduce the BGs to the point that he is no longer polyuric and is at less risk to develop UTIs or other common infections.   B. Be safe and easy to administer.  C. Avoid hypoglycemia.   3. Reasonable treatment goals for now are:  A. AM BGs: 120s-150s  B. Dinner BGs: 150s-200  Plan: 1. Increase glipizide XL to one pill in the AM  and one pill at dinner. Continue Januvia at one pill each AM. 2. Call Friday afternoon to discuss BG results.  David Stall

## 2012-02-15 ENCOUNTER — Telehealth: Payer: Self-pay | Admitting: "Endocrinology

## 2012-02-15 NOTE — Telephone Encounter (Signed)
While at home yesterday afternoon I received a call from Ms. Aldean Ast, Mr. Giammarco's daughter. She was quite distraught and wanted to talk about her father's issues. When I arrived at my clinic this morning I wrote this note. 1. Since returning to their current home situation, "things have gone from bad to worse". Her dad is "treating my mother awfully". He is refusing to take his medications. He refuses to eat the healthy food that is provided to him and instead just wants to eat pasta and desserts. He refuses to drink water or other non-caloric drinks, demanding instead to receive his [employer]. When the home health nurse came on Friday he lied to her, told her that he was taking his meds, and told her his doctor said he did not have to have his BGs checked. He even tried to bribe the home health personnel to buy [employer] and certain starchy foods for him. When his son, other daughter, and Ms Aldean Ast confronted him with what he's doing wrong on Saturday, he yelled at them, called them names, and demanded that they give him his car keys. When they refused to do what he wanted, he threw them out of his house.   Yesterday her dad stayed in bed most of the day.  2. BGs have been variable, from 92-289. She feels that he is getting dehydrated again. Apparently when he went to the store last week he passes out and fell. After having some liquid to drink he felt better and was able to walk back out to the car.  3. I told Ms. Aldean Ast that if she is really concerned that he is dehydrated and is physically unable to get our of bed, she should call EMS and have him taken to the ED. She did not feel that he was that bad and did not want to go to the ED. I suggested that she try to compromise with him. Give him the [facility], but only if he will take his meds. Water the [employer] down by 10% after several drinks and down by 20% after a few more drinks. If all he will eat is pasta, give him the pasta, but  also increase his glipizide XL to 10 mg, twice daily. That should get him through the next 24-48 hours. 4. It really sounds as if his dementia and his wife's dementia are the major problems facing the couple. Even the typical assisted living situation may not be sufficient for them if they can't be trusted to take medications and to eat and drink reliably. Perhaps putting Mr. Mangels on an anti-psychotic medication might modify his behavior enough for the couple to be safe in an assisted living situation, but perhaps not. They may need a nursing home placement, which he will probably resist. I suggested that she talk with her siblings and discuss the issue of nursing home placement. I also suggested that she contact Dr. Lenord Fellers to discuss the case with her. I told Ms. Aldean Ast that I will contact Dr. Lenord Fellers as well. 5. Ms. Aldean Ast plans to return to her home in [place] later this week. She is not at all confident that her brother and sister, who live in [place], will be willing to actively supervise her parents.

## 2012-03-04 ENCOUNTER — Ambulatory Visit (INDEPENDENT_AMBULATORY_CARE_PROVIDER_SITE_OTHER): Payer: Medicare Other | Admitting: Internal Medicine

## 2012-03-04 ENCOUNTER — Other Ambulatory Visit: Payer: Self-pay | Admitting: Internal Medicine

## 2012-03-04 ENCOUNTER — Encounter: Payer: Self-pay | Admitting: Internal Medicine

## 2012-03-04 VITALS — BP 112/54 | HR 76 | Temp 97.4°F | Ht 65.0 in | Wt 185.0 lb

## 2012-03-04 DIAGNOSIS — R634 Abnormal weight loss: Secondary | ICD-10-CM

## 2012-03-04 DIAGNOSIS — E119 Type 2 diabetes mellitus without complications: Secondary | ICD-10-CM

## 2012-03-04 DIAGNOSIS — I10 Essential (primary) hypertension: Secondary | ICD-10-CM

## 2012-03-04 LAB — HEMOGLOBIN A1C: Mean Plasma Glucose: 180 mg/dL — ABNORMAL HIGH (ref <117)

## 2012-03-04 LAB — CBC WITH DIFFERENTIAL/PLATELET
Basophils Relative: 1 % (ref 0–1)
Eosinophils Absolute: 0.6 10*3/uL (ref 0.0–0.7)
MCH: 28.9 pg (ref 26.0–34.0)
MCHC: 33.6 g/dL (ref 30.0–36.0)
Monocytes Relative: 7 % (ref 3–12)
Neutrophils Relative %: 54 % (ref 43–77)
Platelets: 237 10*3/uL (ref 150–400)

## 2012-03-04 LAB — COMPREHENSIVE METABOLIC PANEL
Alkaline Phosphatase: 71 U/L (ref 39–117)
Glucose, Bld: 155 mg/dL — ABNORMAL HIGH (ref 70–99)
Sodium: 136 mEq/L (ref 135–145)
Total Bilirubin: 0.4 mg/dL (ref 0.3–1.2)
Total Protein: 6.8 g/dL (ref 6.0–8.3)

## 2012-03-04 LAB — TSH: TSH: 0.834 u[IU]/mL (ref 0.350–4.500)

## 2012-03-08 ENCOUNTER — Encounter: Payer: Self-pay | Admitting: Internal Medicine

## 2012-03-08 LAB — IRON AND TIBC: Iron: 63 ug/dL (ref 42–165)

## 2012-03-08 NOTE — Progress Notes (Signed)
  Subjective:    Patient ID: Dylan Jenkins, male    DOB: July 02, 1926, 78 y.o.   MRN: [id number]  HPI Accompanied today by wife and his daughter Dylan Jenkins. He and his wife have now moved to a retirement village in [place] near his daughter. His wife has dementia. Wife needs hip replacement in the near future. She is obsessed with having the surgery done. Dylan Jenkins was found in a diabetic coma recently. He's off all insulin and soft good deal of weight. He was hospitalized in [place]. He is getting home physical therapy now. Daughter says that they have completed much of their savings helping their son who has history of alcohol abuse. They really can't afford to go to an exclusive retirement facility such as [facility], she says. Wife gets up in the middle of night and wanders around forgetting what time it is. This is concerning to him. He is very worried about his wife and feels very responsible for her. Says he doesn't rest well at night because of her wondering. He agrees in front of his daughter that wife may have to go to a memory care facility.  Wants me to order him a motorized wheelchair but informed him that he is able to ambulate on his own and did not feel he would qualify for that.  Spent 30 minutes talking with patient and his daughter about his situation, his health and his wife's health.    Review of Systems     Objective:   Physical Exam He is alert and oriented x3. Skin is pale warm and dry. HEENT exam: TMs and pharynx are clear. Neck is supple without JVD. No carotid bruits. Chest clear to auscultation. Cardiac exam regular rate and rhythm normal S1 and S2. Extremities without edema. He is generally deconditioned and a bit weak unable to walk on his on. Moves all 4 extremities.        Assessment & Plan:  Hypertension  Hyperlipidemia  History of diabetes mellitus  Coronary artery disease  History of pacemaker and defibrillator insertions  History of congestive heart  failure  History of spinal stenosis  History of depression  History of BPH  Plan: Patient is to return in 6 months. Stay off glucose lowering medications for now. Continue physical therapy at home

## 2012-03-08 NOTE — Progress Notes (Signed)
Labs added to xisting bloodwork

## 2012-03-08 NOTE — Patient Instructions (Addendum)
Continue physical therapy at home. Return in 6 months. Continue same medications. Stay off glucose lowering medicines for now.

## 2012-03-14 ENCOUNTER — Other Ambulatory Visit: Payer: Self-pay | Admitting: Internal Medicine

## 2012-03-21 ENCOUNTER — Telehealth: Payer: Self-pay | Admitting: Internal Medicine

## 2012-03-25 ENCOUNTER — Ambulatory Visit: Payer: Medicare Other | Admitting: Internal Medicine

## 2012-04-17 ENCOUNTER — Other Ambulatory Visit: Payer: Self-pay | Admitting: Internal Medicine

## 2012-04-27 ENCOUNTER — Telehealth: Payer: Self-pay | Admitting: Internal Medicine

## 2012-04-27 NOTE — Telephone Encounter (Signed)
04-27-12 called pt re past due defib check with taylor, wife having surgery will call back/mt

## 2012-05-31 ENCOUNTER — Other Ambulatory Visit: Payer: Self-pay | Admitting: Internal Medicine

## 2012-06-09 ENCOUNTER — Telehealth: Payer: Self-pay | Admitting: Internal Medicine

## 2012-06-09 NOTE — Telephone Encounter (Signed)
06-09-12 lmm @ 331pm for pt to call to rs missed April defib check with taylor/mt

## 2012-06-21 ENCOUNTER — Ambulatory Visit: Payer: Medicare Other | Admitting: "Endocrinology

## 2012-07-05 ENCOUNTER — Telehealth: Payer: Self-pay | Admitting: Internal Medicine

## 2012-07-05 ENCOUNTER — Ambulatory Visit: Payer: Medicare Other | Admitting: Internal Medicine

## 2012-08-04 ENCOUNTER — Encounter: Payer: Self-pay | Admitting: *Deleted

## 2012-08-26 ENCOUNTER — Telehealth: Payer: Self-pay | Admitting: Internal Medicine

## 2012-08-26 NOTE — Telephone Encounter (Signed)
Granddaughter wanted to know if [facility] and [facility] could have Tamiflu.  She and her Mother both have it and they wanted it.  Bonita Quin advised that they would need to be seen.  Granddaughter was not given any further medical information due to HIPPA and she not having the authorization to speak on their behalf.

## 2012-10-05 ENCOUNTER — Encounter: Payer: Self-pay | Admitting: Neurosurgery

## 2012-10-06 ENCOUNTER — Ambulatory Visit: Payer: Medicare Other | Admitting: Neurosurgery

## 2012-10-07 ENCOUNTER — Ambulatory Visit: Payer: Medicare Other | Admitting: Internal Medicine

## 2013-01-19 ENCOUNTER — Telehealth: Payer: Self-pay | Admitting: Internal Medicine

## 2013-01-19 ENCOUNTER — Encounter: Payer: Self-pay | Admitting: Internal Medicine

## 2013-01-19 NOTE — Telephone Encounter (Signed)
01-19-13 sent past due letter/mt

## 2013-02-04 ENCOUNTER — Other Ambulatory Visit: Payer: Self-pay | Admitting: Cardiovascular Disease

## 2013-02-06 NOTE — Telephone Encounter (Signed)
Rx was sent to pharmacy electronically. 

## 2013-02-09 ENCOUNTER — Telehealth: Payer: Self-pay | Admitting: Internal Medicine

## 2013-02-09 ENCOUNTER — Encounter: Payer: Self-pay | Admitting: Internal Medicine

## 2013-02-09 NOTE — Telephone Encounter (Addendum)
02-09-13 sent past due letter, certified/mt 02-22-13 letter signed by pt 02-15-13 and sent to office 02-22-13/mt

## 2013-02-15 ENCOUNTER — Other Ambulatory Visit: Payer: Self-pay | Admitting: Cardiovascular Disease

## 2013-02-15 ENCOUNTER — Other Ambulatory Visit: Payer: Self-pay | Admitting: Internal Medicine

## 2013-02-16 NOTE — Telephone Encounter (Signed)
Rx was sent to pharmacy electronically. 

## 2013-02-16 NOTE — Telephone Encounter (Signed)
Call druggist and see who has been refilling these meds. We have not seen him in some time and they are living in Assisted Living in [place]

## 2013-02-23 ENCOUNTER — Ambulatory Visit (INDEPENDENT_AMBULATORY_CARE_PROVIDER_SITE_OTHER): Payer: Medicare Other | Admitting: Internal Medicine

## 2013-02-23 ENCOUNTER — Encounter: Payer: Self-pay | Admitting: Internal Medicine

## 2013-02-23 VITALS — BP 124/60 | HR 76 | Temp 97.5°F | Wt 188.0 lb

## 2013-02-23 DIAGNOSIS — M48061 Spinal stenosis, lumbar region without neurogenic claudication: Secondary | ICD-10-CM

## 2013-02-23 DIAGNOSIS — Z125 Encounter for screening for malignant neoplasm of prostate: Secondary | ICD-10-CM

## 2013-02-23 DIAGNOSIS — I2510 Atherosclerotic heart disease of native coronary artery without angina pectoris: Secondary | ICD-10-CM

## 2013-02-23 DIAGNOSIS — I70209 Unspecified atherosclerosis of native arteries of extremities, unspecified extremity: Secondary | ICD-10-CM

## 2013-02-23 DIAGNOSIS — E119 Type 2 diabetes mellitus without complications: Secondary | ICD-10-CM

## 2013-02-23 DIAGNOSIS — E785 Hyperlipidemia, unspecified: Secondary | ICD-10-CM

## 2013-02-23 DIAGNOSIS — I10 Essential (primary) hypertension: Secondary | ICD-10-CM

## 2013-02-23 DIAGNOSIS — Z95810 Presence of automatic (implantable) cardiac defibrillator: Secondary | ICD-10-CM

## 2013-02-23 DIAGNOSIS — R413 Other amnesia: Secondary | ICD-10-CM

## 2013-02-23 LAB — COMPREHENSIVE METABOLIC PANEL
ALT: 12 U/L (ref 0–53)
AST: 13 U/L (ref 0–37)
Albumin: 4 g/dL (ref 3.5–5.2)
Alkaline Phosphatase: 81 U/L (ref 39–117)
BUN: 23 mg/dL (ref 6–23)
Calcium: 9.1 mg/dL (ref 8.4–10.5)
Chloride: 97 mEq/L (ref 96–112)
Potassium: 4.5 mEq/L (ref 3.5–5.3)
Sodium: 136 mEq/L (ref 135–145)
Total Protein: 7.1 g/dL (ref 6.0–8.3)

## 2013-02-23 LAB — CBC WITH DIFFERENTIAL/PLATELET
Basophils Absolute: 0 10*3/uL (ref 0.0–0.1)
Basophils Relative: 1 % (ref 0–1)
HCT: 40.6 % (ref 39.0–52.0)
Hemoglobin: 13.5 g/dL (ref 13.0–17.0)
Lymphocytes Relative: 31 % (ref 12–46)
MCHC: 33.3 g/dL (ref 30.0–36.0)
Monocytes Absolute: 0.6 10*3/uL (ref 0.1–1.0)
Neutro Abs: 2.9 10*3/uL (ref 1.7–7.7)
Neutrophils Relative %: 50 % (ref 43–77)
RDW: 15 % (ref 11.5–15.5)
WBC: 5.8 10*3/uL (ref 4.0–10.5)

## 2013-02-23 NOTE — Telephone Encounter (Signed)
Documented in paceart

## 2013-03-13 ENCOUNTER — Telehealth: Payer: Self-pay | Admitting: Internal Medicine

## 2013-03-13 NOTE — Telephone Encounter (Signed)
03-13-13 past 30 days since certified letter sent 02-09-13/mt

## 2013-03-14 ENCOUNTER — Other Ambulatory Visit: Payer: Self-pay | Admitting: Cardiovascular Disease

## 2013-03-14 ENCOUNTER — Other Ambulatory Visit: Payer: Self-pay | Admitting: Internal Medicine

## 2013-03-15 NOTE — Telephone Encounter (Signed)
Rx was sent to pharmacy electronically. 

## 2013-04-14 ENCOUNTER — Telehealth: Payer: Self-pay

## 2013-04-14 NOTE — Telephone Encounter (Signed)
Patient has an appointment at [facility] at their [facility] on August 18, 2013 at 1:00 pm. He is aware of this, and will receive info in the mail as well.

## 2013-05-06 IMAGING — CR DG FOOT COMPLETE 3+V*L*
1 series · 3 of 3 positions shown · non-contrast
Comparison: none

REASON FOR EXAM: evaluate for fracture
COMMENTS:

[Series 1: ap · 0.17mm/px · 3 of 3 slices shown]
[im 1/3]
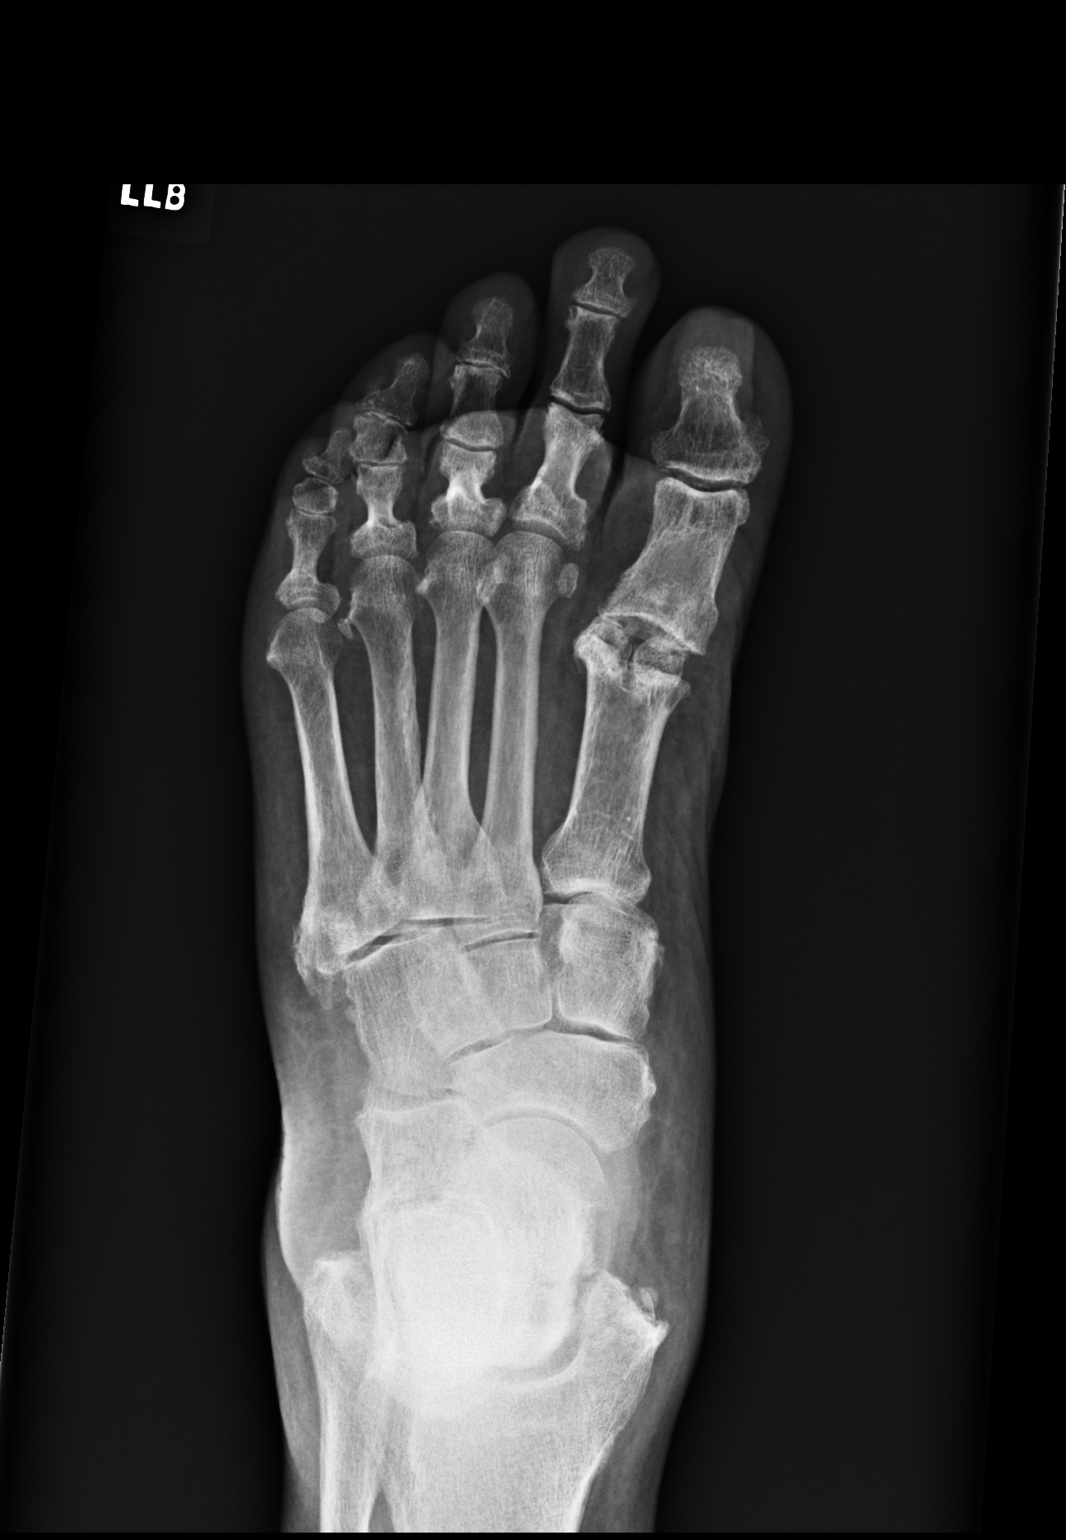
[im 2/3]
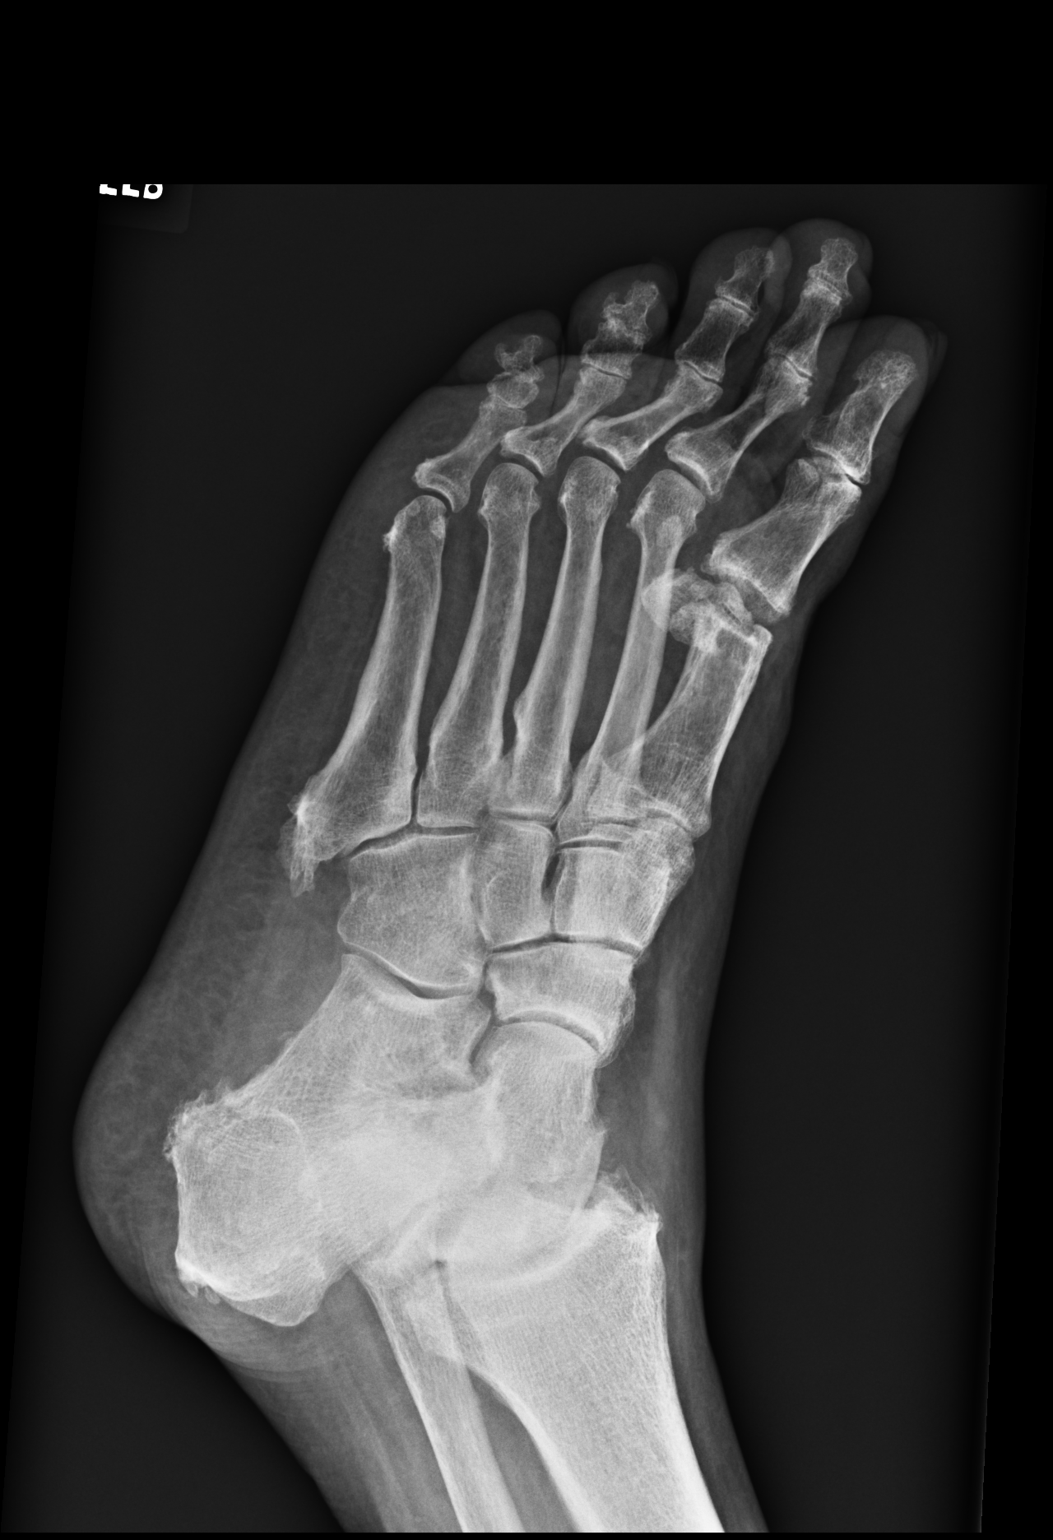
[im 3/3]
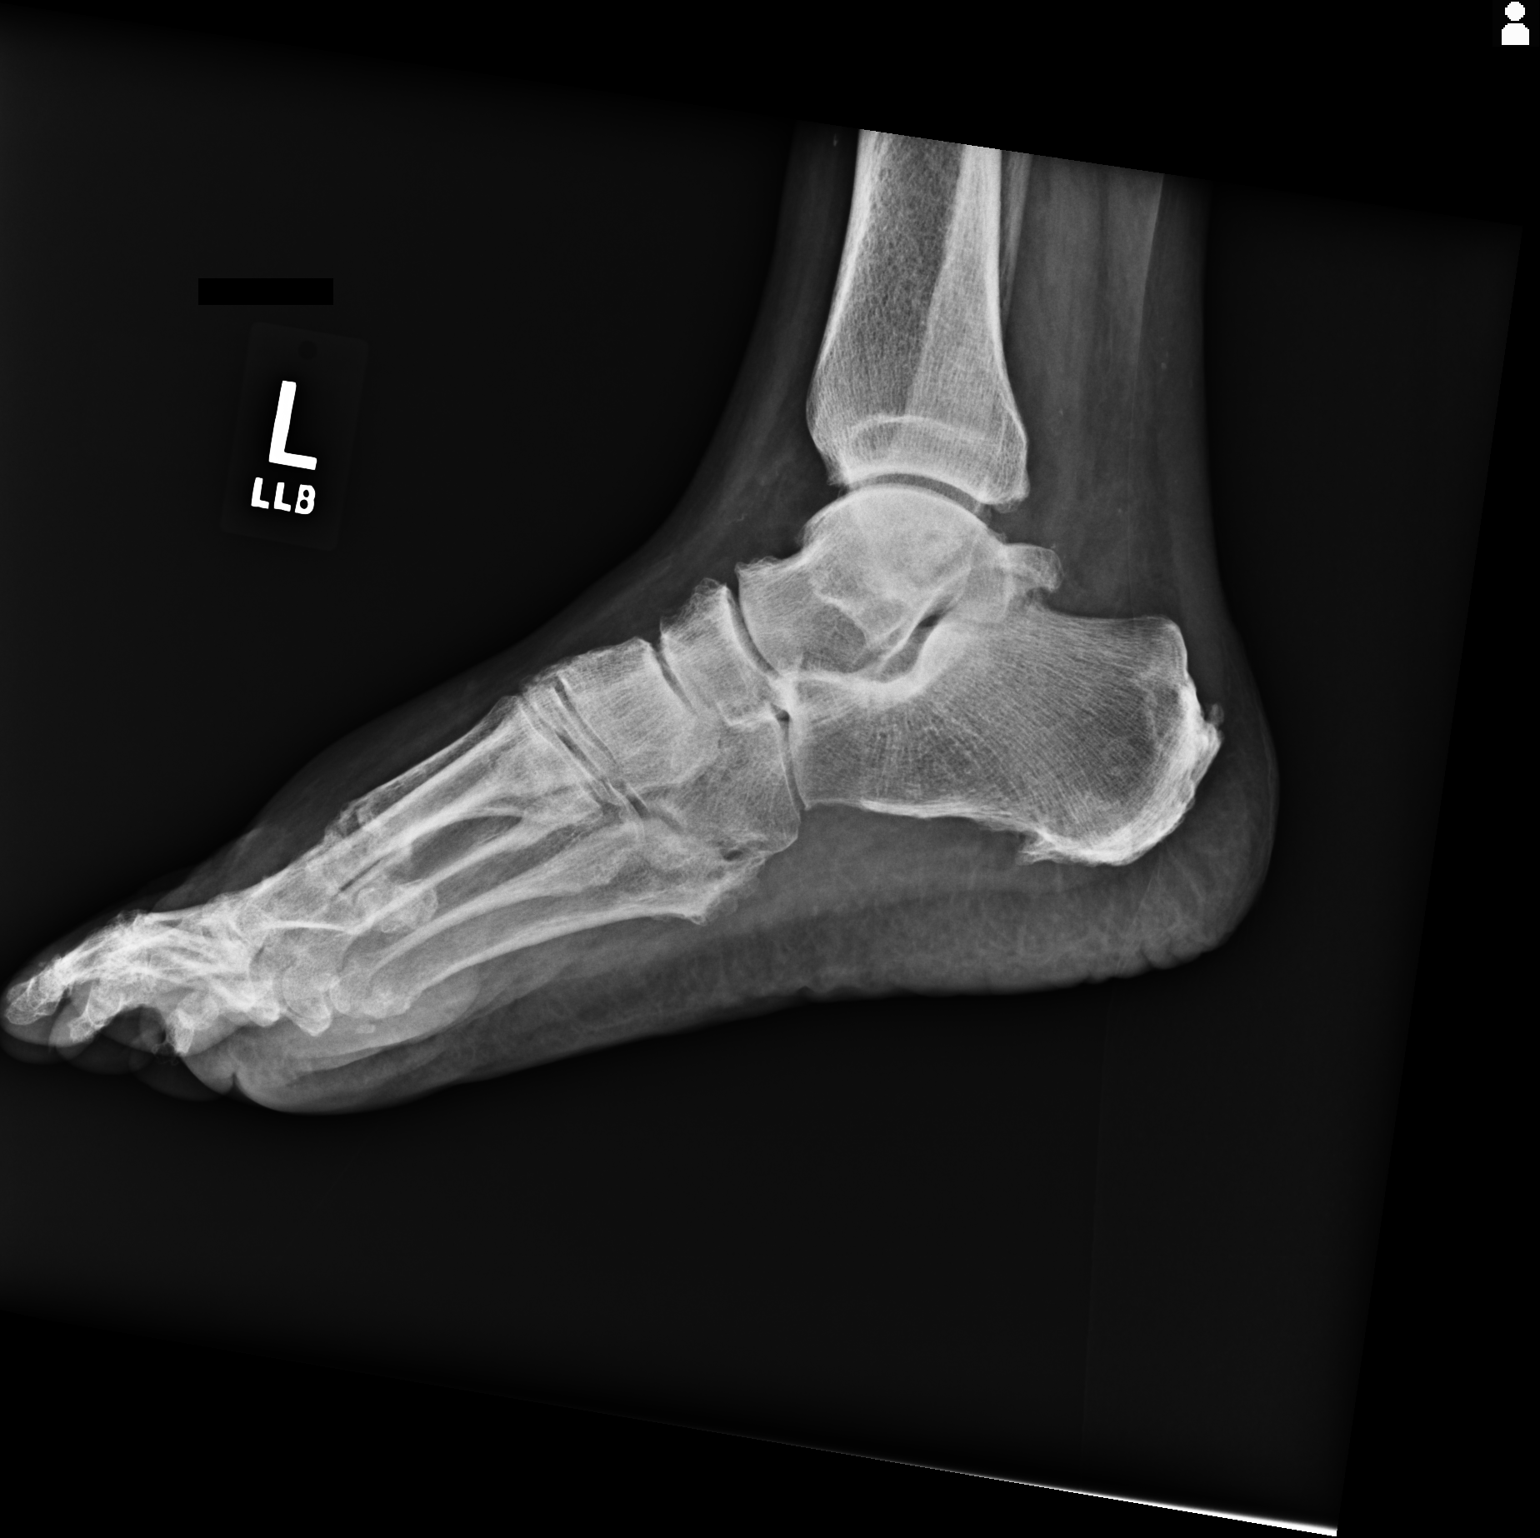

[3 of 3 positions shown; findings below may reference images not displayed]

PROCEDURE:     DXR - DXR FOOT LT COMP W/OBLIQUES  - January 11, 2012  [DATE]

RESULT:     Three views of the left foot are submitted. There are extensive
chronic changes involving the first metatarsophalangeal joint. The patient
may have undergone previous osteotomy here. The bones are mildly osteopenic
diffusely. There are mild interphalangeal joint degenerative changes. There
is degenerative change associated with the base of the fifth metatarsal. I
do not see evidence of an acute fracture. There are plantar and Achilles
region calcaneal spur.
IMPRESSION: There are degenerative changes of the left foot. There are
findings in the region of the base of the fifth metatarsal as could be
symptomatic. I do not see definite evidence of an acute fracture.
Correlation with any surgical or traumatic history associated with the first
metatarsophalangeal joint is needed. Followup imaging with CT scanning or
MRI is available upon request.

## 2013-05-07 IMAGING — CR RIGHT FOOT COMPLETE - 3+ VIEW
1 series · 3 of 3 positions shown · non-contrast
Comparison: none

REASON FOR EXAM: evaluate for fracture
COMMENTS:

[Series 7: x foot ap right · 0.14mm/px · 3 of 3 slices shown]
[im 1/3]
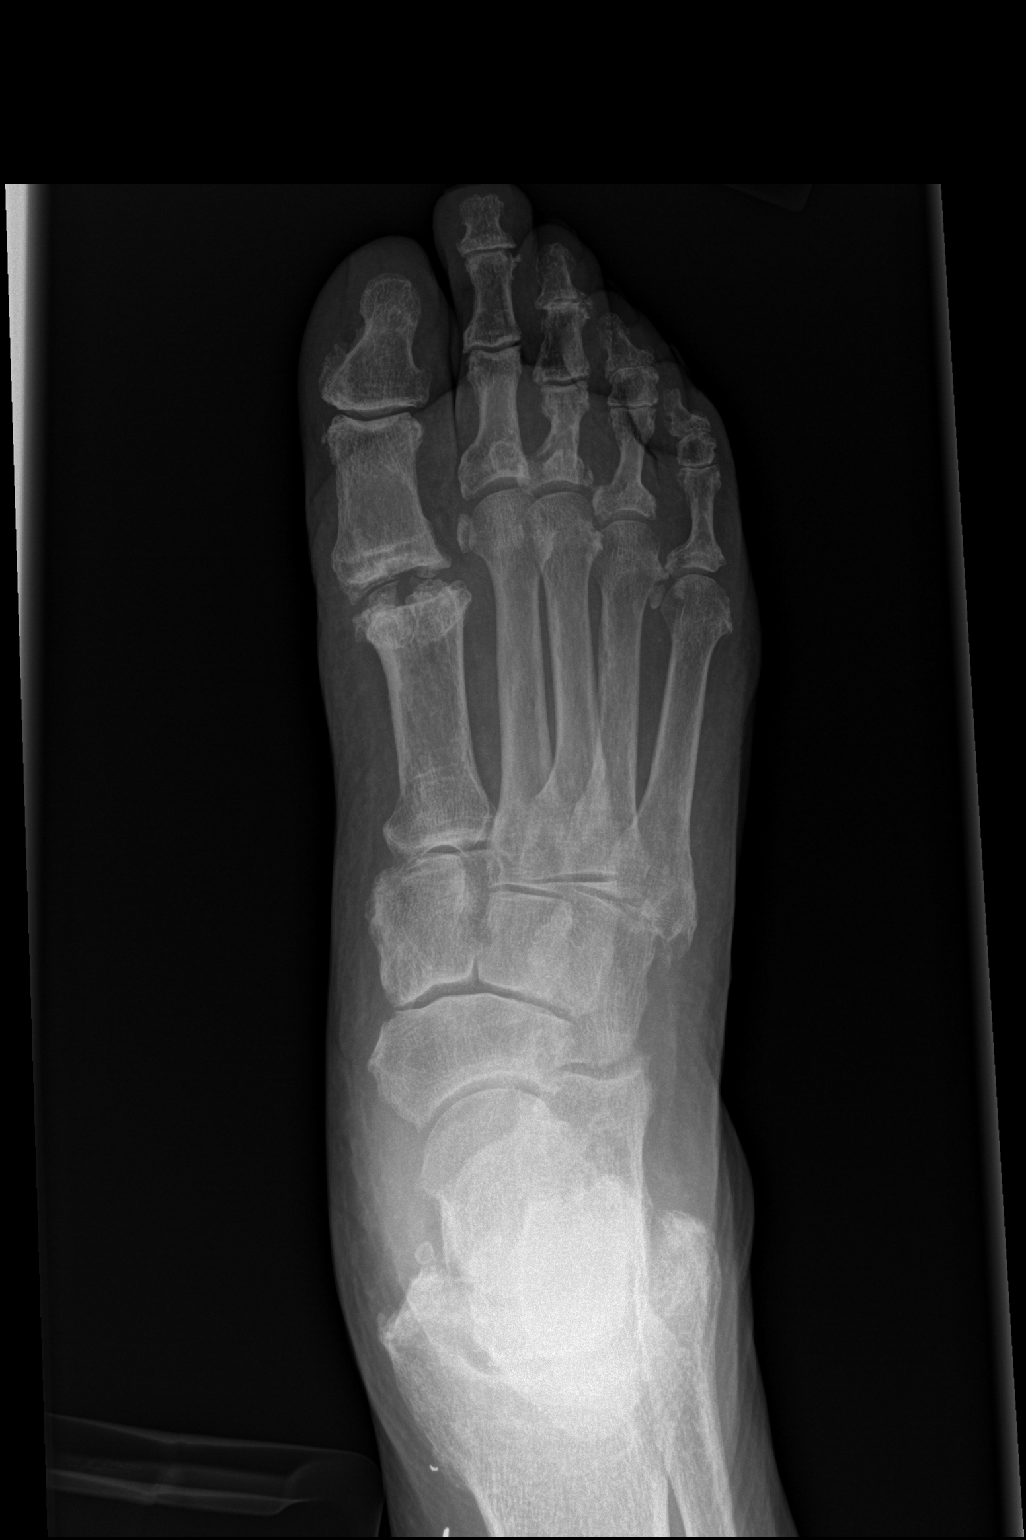
[im 2/3]
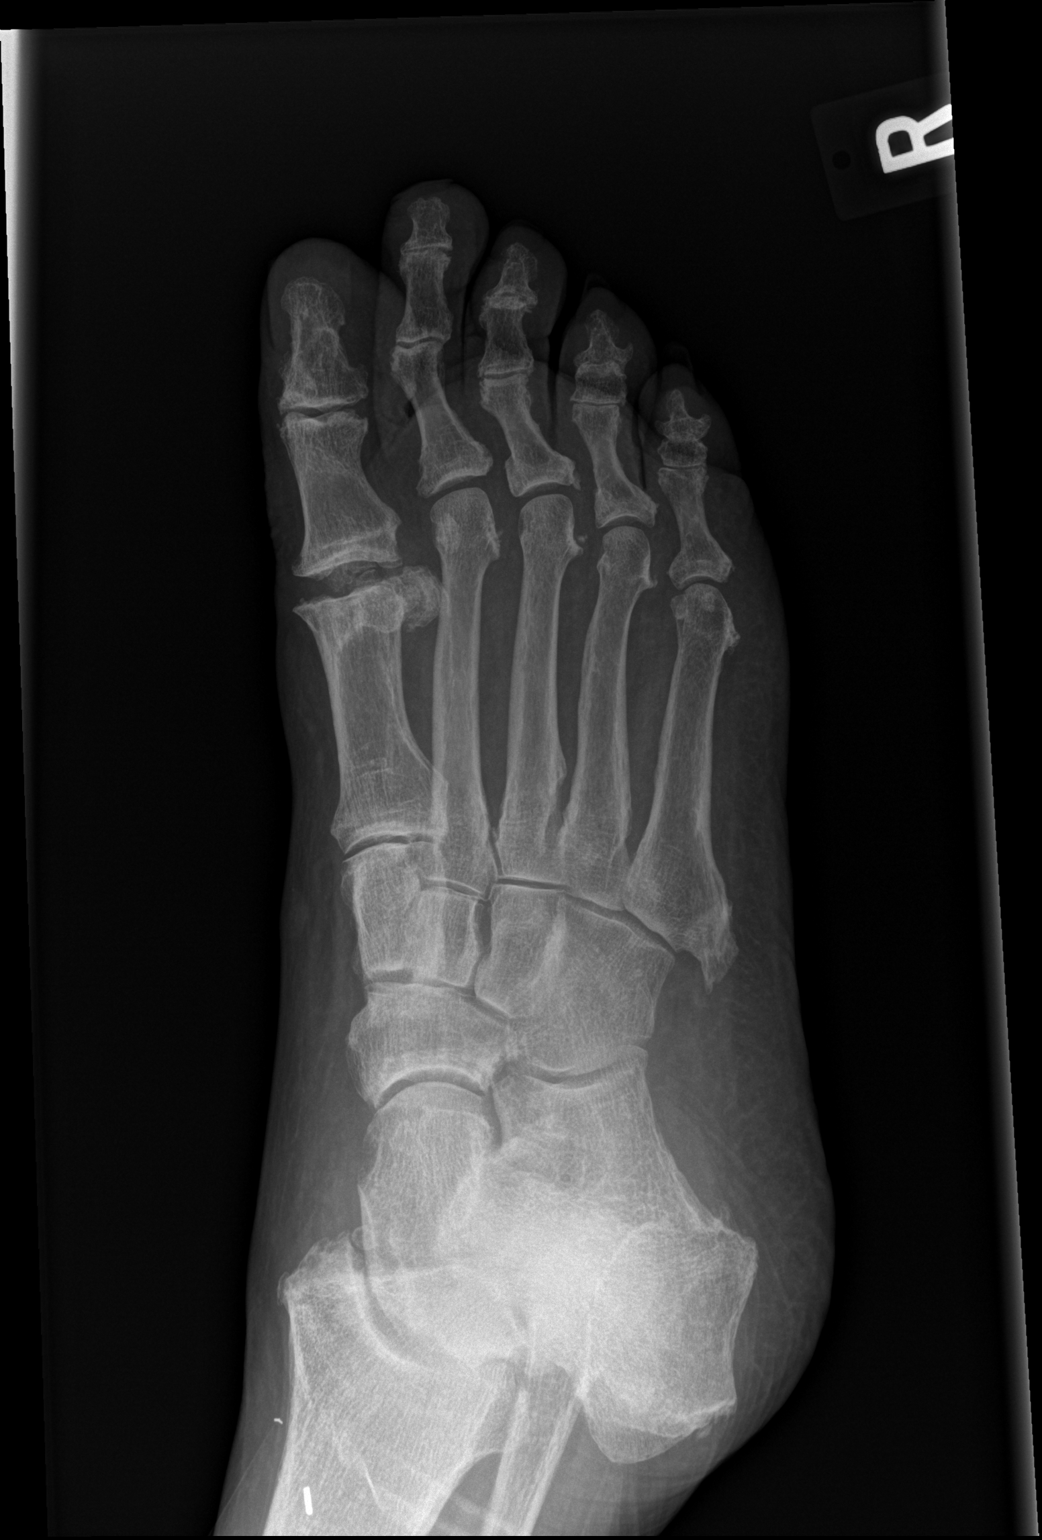
[im 3/3]
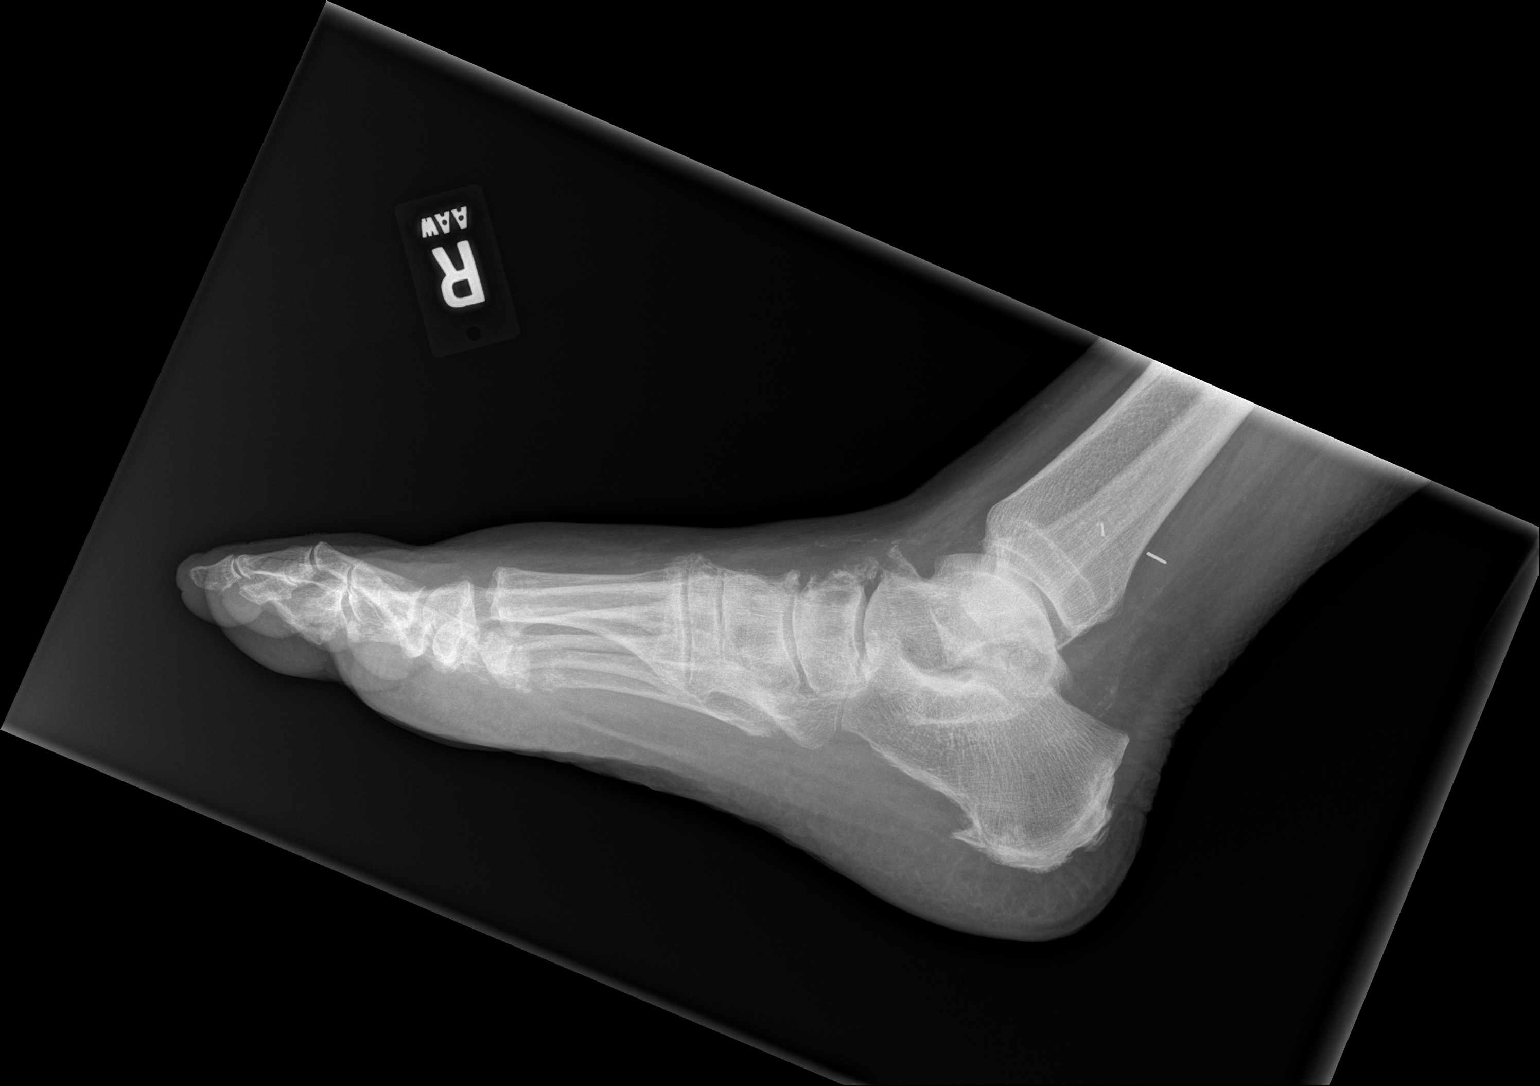

[3 of 3 positions shown; findings below may reference images not displayed]

PROCEDURE:     DXR - DXR FOOT RT COMPLETE W/OBLIQUES  - January 12, 2012  [DATE]

RESULT:     No acute fracture is seen. There is deformity of the distal
first metatarsal that appears likely to be secondary to prior surgery.
Similar changes are observed on the left which was radiographed on 01/11/2012.
No lytic or blastic lesions are seen. No soft tissue foreign body is
identified. In the lateral view, there is noted a plantar calcaneal spur.
IMPRESSION: 1.  No definite acute bony abnormalities are seen.
2.  There is deformity at the MP joint of the great toe, likely secondary to
residual change from prior surgery and with there being a similar finding
observed at the MP joint of the left, great toe which was radiographed on
01/11/2012.

[REDACTED]

## 2013-05-11 ENCOUNTER — Encounter: Payer: Self-pay | Admitting: Internal Medicine

## 2013-05-11 ENCOUNTER — Ambulatory Visit (INDEPENDENT_AMBULATORY_CARE_PROVIDER_SITE_OTHER): Payer: Medicare Other | Admitting: Internal Medicine

## 2013-05-11 VITALS — BP 130/64 | HR 73 | Ht 65.0 in | Wt 186.4 lb

## 2013-05-11 DIAGNOSIS — I5022 Chronic systolic (congestive) heart failure: Secondary | ICD-10-CM

## 2013-05-11 DIAGNOSIS — I2510 Atherosclerotic heart disease of native coronary artery without angina pectoris: Secondary | ICD-10-CM

## 2013-05-11 DIAGNOSIS — Z95810 Presence of automatic (implantable) cardiac defibrillator: Secondary | ICD-10-CM

## 2013-05-11 LAB — ICD DEVICE OBSERVATION
AL AMPLITUDE: 1.8466 mv
ATRIAL PACING ICD: 0.91 pct
BAMS-0001: 170 {beats}/min
CHARGE TIME: 13.623 s
FVT: 0
LV LEAD THRESHOLD: 2 V
PACEART VT: 0
RV LEAD IMPEDENCE ICD: 464 Ohm
RV LEAD THRESHOLD: 1.5 V
TOT-0001: 3
TZAT-0002ATACH: NEGATIVE
TZAT-0002ATACH: NEGATIVE
TZAT-0004SLOWVT: 8
TZAT-0005SLOWVT: 88 pct
TZAT-0005SLOWVT: 91 pct
TZAT-0011SLOWVT: 10 ms
TZAT-0011SLOWVT: 10 ms
TZAT-0012ATACH: 150 ms
TZAT-0012FASTVT: 200 ms
TZAT-0018ATACH: NEGATIVE
TZAT-0019ATACH: 6 V
TZAT-0019ATACH: 6 V
TZAT-0019ATACH: 6 V
TZAT-0019FASTVT: 8 V
TZAT-0020ATACH: 1.5 ms
TZAT-0020FASTVT: 1.5 ms
TZON-0003SLOWVT: 340 ms
TZST-0001FASTVT: 2
TZST-0001FASTVT: 3
TZST-0001FASTVT: 5
TZST-0001SLOWVT: 4
TZST-0001SLOWVT: 6
TZST-0002FASTVT: NEGATIVE
TZST-0002FASTVT: NEGATIVE
TZST-0003SLOWVT: 25 J
TZST-0003SLOWVT: 35 J
VENTRICULAR PACING ICD: 98.02 pct
VF: 0

## 2013-05-11 NOTE — Progress Notes (Signed)
Patient Care Team: Margaree Mackintosh, MD as PCP - General   HPI  Dylan Jenkins is a 79 y.o. male Seen for CRT-D implantation undertaken 2009. He has a history of ischemic cardiomyopathy identified in 2002. He had prior bypass surgeryx6 in 2000. Ejection fraction previously was 30%. Initial implantation was undertaken for nonsustained ventricular tachycardia with inducible sustained VT  He had previously been cared for by Dr. Elsie Lincoln and was seen at [facility] as recently as 7/13.at that time an echocardiogram demonstrated severe left ventricular dysfunction with an EF of 25-35%. There were segmental wall motion abnormalities. Left atrial size was markedly enlarged at 46  He also had mild aortic stenosis with mean gradient of 17 He he has limited mobility. He gets around on scooter and a walker. He denies shortness of breath chest pain or peripheral edema.   Past Medical History  Diagnosis Date  . Diabetes mellitus   . Obesity   . Ischemic cardiomyopathy     s/p  CABG 2000; EF 25% 2013  . Hypertension   . Neuropathy, diabetic   . Peripheral angiopathy   . Goiter   . Depression   . Atherosclerotic heart disease   . PVD (peripheral vascular disease)   . Ventricular tachycardia   . HLD (hyperlipidemia)   . GERD (gastroesophageal reflux disease)   . Renal artery stenosis   . Osteoarthritis   . Sinusitis   . Biventricular implantable cardioverter-defibrillator -Medtronic     Initial implant 2002, CRT upgrade 2009  . Aortic stenosis     Mean gradient 17 2013    Past Surgical History  Procedure Laterality Date  . Coronary artery bypass graft    . Peripheral arterial bypass graft      Current Outpatient Prescriptions  Medication Sig Dispense Refill  . aspirin 81 MG chewable tablet Chew 81 mg by mouth daily.        . digoxin (LANOXIN) 0.125 MG tablet Take 125 mcg by mouth daily.        . furosemide (LASIX) 40 MG tablet Take 40 mg by mouth daily.        Marland Kitchen glipiZIDE (GLUCOTROL) 10 MG  tablet Take 10 mg by mouth. 2 tabs am/2 tabs pm       . insulin glargine (LANTUS) 100 UNIT/ML injection Inject 40 Units into the skin at bedtime.        Marland Kitchen JANUVIA 50 MG tablet TAKE 1 TABLET BY MOUTH IN THE MORNING  30 tablet  0  . Multiple Vitamin (MULTIVITAMIN) tablet Take 1 tablet by mouth daily.        . nabumetone (RELAFEN) 500 MG tablet TAKE 1 TABLET BY MOUTH TWICE A DAY  180 tablet  3  . ramipril (ALTACE) 10 MG tablet Take 10 mg by mouth daily.        . tamsulosin (FLOMAX) 0.4 MG CAPS capsule TAKE 1 TABLET BY MOUTH EVERY DAY  15 capsule  0   No current facility-administered medications for this visit.   Soc hx married. He has children. He has recently moved to independent living  He does not use cigarettes or alcohol    No Known Allergies  Review of Systems negative except from HPI and PMH  Physical Exam BP 130/64  Pulse 73  Ht 5\' 5"  (1.651 m)  Wt 186 lb 6.4 oz (84.55 kg)  BMI 31.02 kg/m2 Alert and oriented in no acute distress HENT- normal Eyes- EOMI, without scleral icterus Skin- warm and dry; without rashes  LN-neg Neck- supple without thyromegaly, JVP-flat, carotids brisk and full without bruits Back-without CVAT or kyphosis Lungs-clear to auscultation CV-Regular rate and rhythm, nl S1 and single S2 and a 2/6 systolic murmur that radiates to the neck and to the apex gallops or rubs, S4-present Abd-soft with active bowel sounds; no midline pulsation or hepatomegaly Pulses-intact femoral and distal MKS-without gross deformity Neuro- Ax O, CN3-12 intact, grossly normal motor and sensory function Affect engaging     Assessment and  Plan

## 2013-05-11 NOTE — Patient Instructions (Addendum)
Your physician has recommended that you have a defibrillator generator (battery) change. Please see the instruction sheet given to you today for more information. ** We could do this on Tuesday 10/14 or Thursday 10/16 in the afternoon. Please call the office back as soon as possible so we may get you scheduled for this. [phone].  Your physician recommends that you continue on your current medications as directed. Please refer to the Current Medication list given to you today.

## 2013-05-11 NOTE — Assessment & Plan Note (Signed)
The patient's device reached ERI 4 months ago. He has had no untoward symptoms. He recalls no ICD discharges. We have reviewed the benefits and risks of generator replacement.  These include but are not limited to lead fracture and infection.  The patient understands, agrees and is willing to proceed.   We've also had a lengthy discussion as to whether to pursue CRT-D generator replacement or CRT P. Generator replacement;  this relates to end-of-life issues which he has already broached with his primary physicians. He will review this with his family.

## 2013-05-11 NOTE — Assessment & Plan Note (Signed)
Mild by last evaluation; the absence of symptoms would not pursue it

## 2013-05-11 NOTE — Assessment & Plan Note (Signed)
Stable. We'll continue current medications 

## 2013-05-16 ENCOUNTER — Other Ambulatory Visit: Payer: Self-pay

## 2013-05-16 DIAGNOSIS — I5022 Chronic systolic (congestive) heart failure: Secondary | ICD-10-CM

## 2013-05-16 DIAGNOSIS — I255 Ischemic cardiomyopathy: Secondary | ICD-10-CM

## 2013-05-16 DIAGNOSIS — I4510 Unspecified right bundle-branch block: Secondary | ICD-10-CM

## 2013-05-16 DIAGNOSIS — I10 Essential (primary) hypertension: Secondary | ICD-10-CM

## 2013-05-19 ENCOUNTER — Other Ambulatory Visit: Payer: Self-pay | Admitting: *Deleted

## 2013-05-19 ENCOUNTER — Encounter (HOSPITAL_COMMUNITY): Payer: Self-pay | Admitting: Pharmacy Technician

## 2013-05-22 ENCOUNTER — Ambulatory Visit (INDEPENDENT_AMBULATORY_CARE_PROVIDER_SITE_OTHER): Payer: Medicare Other | Admitting: *Deleted

## 2013-05-22 DIAGNOSIS — I2589 Other forms of chronic ischemic heart disease: Secondary | ICD-10-CM

## 2013-05-22 DIAGNOSIS — I255 Ischemic cardiomyopathy: Secondary | ICD-10-CM

## 2013-05-22 DIAGNOSIS — I5022 Chronic systolic (congestive) heart failure: Secondary | ICD-10-CM

## 2013-05-22 DIAGNOSIS — I4510 Unspecified right bundle-branch block: Secondary | ICD-10-CM

## 2013-05-22 DIAGNOSIS — I10 Essential (primary) hypertension: Secondary | ICD-10-CM

## 2013-05-23 ENCOUNTER — Telehealth: Payer: Self-pay

## 2013-05-23 LAB — BASIC METABOLIC PANEL
BUN/Creatinine Ratio: 11 (ref 10–22)
BUN: 17 mg/dL (ref 8–27)
CO2: 24 mmol/L (ref 18–29)
Calcium: 8.9 mg/dL (ref 8.6–10.2)
Chloride: 101 mmol/L (ref 97–108)
Creatinine, Ser: 1.53 mg/dL — ABNORMAL HIGH (ref 0.76–1.27)
GFR calc non Af Amer: 41 mL/min/{1.73_m2} — ABNORMAL LOW (ref >59)

## 2013-05-23 LAB — CBC WITH DIFFERENTIAL/PLATELET
Basos: 1 %
Eos: 8 %
Hemoglobin: 12.3 g/dL — ABNORMAL LOW (ref 12.6–17.7)
Lymphs: 31 %
MCH: 29 pg (ref 26.6–33.0)
Monocytes: 7 %
Neutrophils Absolute: 4.3 10*3/uL (ref 1.4–7.0)
RBC: 4.24 x10E6/uL (ref 4.14–5.80)
WBC: 8 10*3/uL (ref 3.4–10.8)

## 2013-05-23 NOTE — Telephone Encounter (Signed)
Patient calls today to report he will be going into [facility] tomorrow for replacement of his defibrillator battery. Dr. Graciela Husbands to do it.

## 2013-05-24 MED ORDER — CEFAZOLIN SODIUM-DEXTROSE 2-3 GM-% IV SOLR
2.0000 g | INTRAVENOUS | Status: AC
  Filled 2013-05-24 (×2): qty 50

## 2013-05-24 MED ORDER — SODIUM CHLORIDE 0.9 % IR SOLN
80.0000 mg | Status: AC
  Filled 2013-05-24: qty 2

## 2013-05-24 MED ORDER — SODIUM CHLORIDE 0.9 % IV SOLN
INTRAVENOUS | Status: DC
Start: 2013-05-24 — End: 2013-05-25
  Administered 2013-05-25: 11:00:00 via INTRAVENOUS

## 2013-05-25 ENCOUNTER — Encounter (HOSPITAL_COMMUNITY)
Admission: RE | Disposition: A | Discharge status: Home / Self Care | Payer: Medicare Other | Source: Ambulatory Visit | Attending: Internal Medicine

## 2013-05-25 ENCOUNTER — Other Ambulatory Visit: Payer: Self-pay

## 2013-05-25 ENCOUNTER — Ambulatory Visit (HOSPITAL_COMMUNITY)
Admission: RE | Admit: 2013-05-25 | Discharge: 2013-05-25 | Disposition: A | Discharge status: Home / Self Care | Payer: Medicare Other | Source: Ambulatory Visit | Attending: Internal Medicine | Admitting: Internal Medicine

## 2013-05-25 DIAGNOSIS — Z4509 Encounter for adjustment and management of other cardiac device: Secondary | ICD-10-CM | POA: Insufficient documentation

## 2013-05-25 DIAGNOSIS — E785 Hyperlipidemia, unspecified: Secondary | ICD-10-CM | POA: Insufficient documentation

## 2013-05-25 DIAGNOSIS — E1149 Type 2 diabetes mellitus with other diabetic neurological complication: Secondary | ICD-10-CM | POA: Insufficient documentation

## 2013-05-25 DIAGNOSIS — Z951 Presence of aortocoronary bypass graft: Secondary | ICD-10-CM | POA: Insufficient documentation

## 2013-05-25 DIAGNOSIS — K219 Gastro-esophageal reflux disease without esophagitis: Secondary | ICD-10-CM | POA: Insufficient documentation

## 2013-05-25 DIAGNOSIS — I2589 Other forms of chronic ischemic heart disease: Secondary | ICD-10-CM | POA: Insufficient documentation

## 2013-05-25 DIAGNOSIS — I5022 Chronic systolic (congestive) heart failure: Secondary | ICD-10-CM

## 2013-05-25 DIAGNOSIS — E1142 Type 2 diabetes mellitus with diabetic polyneuropathy: Secondary | ICD-10-CM | POA: Insufficient documentation

## 2013-05-25 DIAGNOSIS — Z45018 Encounter for adjustment and management of other part of cardiac pacemaker: Secondary | ICD-10-CM

## 2013-05-25 DIAGNOSIS — I509 Heart failure, unspecified: Secondary | ICD-10-CM | POA: Insufficient documentation

## 2013-05-25 DIAGNOSIS — M1990 Unspecified osteoarthritis, unspecified site: Secondary | ICD-10-CM | POA: Insufficient documentation

## 2013-05-25 DIAGNOSIS — I2510 Atherosclerotic heart disease of native coronary artery without angina pectoris: Secondary | ICD-10-CM

## 2013-05-25 DIAGNOSIS — I739 Peripheral vascular disease, unspecified: Secondary | ICD-10-CM | POA: Insufficient documentation

## 2013-05-25 DIAGNOSIS — I359 Nonrheumatic aortic valve disorder, unspecified: Secondary | ICD-10-CM | POA: Insufficient documentation

## 2013-05-25 DIAGNOSIS — E669 Obesity, unspecified: Secondary | ICD-10-CM | POA: Insufficient documentation

## 2013-05-25 DIAGNOSIS — Z95810 Presence of automatic (implantable) cardiac defibrillator: Secondary | ICD-10-CM

## 2013-05-25 DIAGNOSIS — I10 Essential (primary) hypertension: Secondary | ICD-10-CM | POA: Insufficient documentation

## 2013-05-25 HISTORY — PX: BIV PACEMAKER GENERATOR CHANGE OUT: SHX5746

## 2013-05-25 LAB — GLUCOSE, CAPILLARY: Glucose-Capillary: 206 mg/dL — ABNORMAL HIGH (ref 70–99)

## 2013-05-25 LAB — SURGICAL PCR SCREEN
MRSA, PCR: NEGATIVE
Staphylococcus aureus: NEGATIVE

## 2013-05-25 SURGERY — BIV PACEMAKER GENERATOR CHANGE OUT

## 2013-05-25 MED ORDER — ONDANSETRON HCL 4 MG/2ML IJ SOLN: 4.0000 mg | Freq: Four times a day (QID) | INTRAMUSCULAR | Status: DC | PRN

## 2013-05-25 MED ORDER — CHLORHEXIDINE GLUCONATE 4 % EX LIQD
60.0000 mL | Freq: Once | CUTANEOUS | Status: DC
  Filled 2013-05-25: qty 60

## 2013-05-25 MED ORDER — SODIUM CHLORIDE 0.9 % IV SOLN: INTRAVENOUS | Status: AC

## 2013-05-25 MED ORDER — MIDAZOLAM HCL 5 MG/5ML IJ SOLN
INTRAMUSCULAR | Status: AC
  Filled 2013-05-25: qty 5

## 2013-05-25 MED ORDER — MUPIROCIN 2 % EX OINT
TOPICAL_OINTMENT | CUTANEOUS | Status: AC
  Filled 2013-05-25: qty 22

## 2013-05-25 MED ORDER — MUPIROCIN 2 % EX OINT
TOPICAL_OINTMENT | Freq: Two times a day (BID) | CUTANEOUS | Status: DC
  Administered 2013-05-25: 1 via NASAL
  Filled 2013-05-25: qty 22

## 2013-05-25 MED ORDER — LIDOCAINE HCL (PF) 1 % IJ SOLN
INTRAMUSCULAR | Status: AC
  Filled 2013-05-25: qty 60

## 2013-05-25 MED ORDER — FENTANYL CITRATE 0.05 MG/ML IJ SOLN
INTRAMUSCULAR | Status: AC
  Filled 2013-05-25: qty 2

## 2013-05-25 MED ORDER — ACETAMINOPHEN 325 MG PO TABS: 325.0000 mg | ORAL_TABLET | ORAL | Status: DC | PRN

## 2013-05-25 NOTE — Interval H&P Note (Signed)
History and Physical Interval Note:  05/25/2013 1:12 PM  Dylan Jenkins  has presented today for surgery, with the diagnosis of CHF  The various methods of treatment have been discussed with the patient and family. After consideration of risks, benefits and other options for treatment, the patient has consented to  Procedure(s): BIV PACEMAKER GENERATOR CHANGE OUT as a surgical intervention .  The patient's history has been reviewed, patient examined, no change in status, stable for surgery.  I have reviewed the patient's chart and labs.  Questions were answered to the patient's satisfaction.     Sherryl Manges   HE HAS ELECTED TO RECEIVE crt-p instead of CRT-D  ICD Criteria  Current LVEF (within 6 months):25%  NYHA Functional Classification: Class III  Heart Failure History:  Yes, Duration of heart failure since onset is > 9 months  Non-Ischemic Dilated Cardiomyopathy History:  No.  Atrial Fibrillation/Atrial Flutter:  No.  Ventricular Tachycardia History:  Yes, Hemodynamic status unknown. VT Type is unknown.  Cardiac Arrest History:  No  History of Syndromes with Risk of Sudden Death:  No.  Previous ICD:  Yes, ICD Type:  CRT-D, Reason for ICD:  Secondary, reason for secondary prevention:  Not Documented.  Electrophysiology Study: Yes, EP Study timeframe: > 6 months, Ventricular arrhythmias induced,  VT ablation NOT performed.  Anticoagulation Therapy:  Patient is NOT on anticoagulation therapy.   Beta Blocker Therapy:  No, Reason not on Beta Blocker therapy: NOT TOLERATED  Ace Inhibitor/ARB Therapy:  Yes.

## 2013-05-25 NOTE — Progress Notes (Signed)
Discharge instruction given per MD order.  Pt and CG able to verbalize understanding of instructions.  Pt denies any pain at this time.  Pt to car via wheelchair.

## 2013-05-25 NOTE — H&P (View-Only) (Signed)
Patient Care Team: Mary J Baxley, MD as PCP - General   HPI  Dylan Jenkins is a 79 y.o. male Seen for CRT-D implantation undertaken 2009. He has a history of ischemic cardiomyopathy identified in 2002. He had prior bypass surgeryx6 in 2000. Ejection fraction previously was 30%. Initial implantation was undertaken for nonsustained ventricular tachycardia with inducible sustained VT  He had previously been cared for by Dr. Gamble and was seen at [facility] as recently as 7/13.at that time an echocardiogram demonstrated severe left ventricular dysfunction with an EF of 25-35%. There were segmental wall motion abnormalities. Left atrial size was markedly enlarged at 46  He also had mild aortic stenosis with mean gradient of 17 He he has limited mobility. He gets around on scooter and a walker. He denies shortness of breath chest pain or peripheral edema.   Past Medical History  Diagnosis Date  . Diabetes mellitus   . Obesity   . Ischemic cardiomyopathy     s/p  CABG 2000; EF 25% 2013  . Hypertension   . Neuropathy, diabetic   . Peripheral angiopathy   . Goiter   . Depression   . Atherosclerotic heart disease   . PVD (peripheral vascular disease)   . Ventricular tachycardia   . HLD (hyperlipidemia)   . GERD (gastroesophageal reflux disease)   . Renal artery stenosis   . Osteoarthritis   . Sinusitis   . Biventricular implantable cardioverter-defibrillator -Medtronic     Initial implant 2002, CRT upgrade 2009  . Aortic stenosis     Mean gradient 17 2013    Past Surgical History  Procedure Laterality Date  . Coronary artery bypass graft    . Peripheral arterial bypass graft      Current Outpatient Prescriptions  Medication Sig Dispense Refill  . aspirin 81 MG chewable tablet Chew 81 mg by mouth daily.        . digoxin (LANOXIN) 0.125 MG tablet Take 125 mcg by mouth daily.        . furosemide (LASIX) 40 MG tablet Take 40 mg by mouth daily.        . glipiZIDE (GLUCOTROL) 10 MG  tablet Take 10 mg by mouth. 2 tabs am/2 tabs pm       . insulin glargine (LANTUS) 100 UNIT/ML injection Inject 40 Units into the skin at bedtime.        . JANUVIA 50 MG tablet TAKE 1 TABLET BY MOUTH IN THE MORNING  30 tablet  0  . Multiple Vitamin (MULTIVITAMIN) tablet Take 1 tablet by mouth daily.        . nabumetone (RELAFEN) 500 MG tablet TAKE 1 TABLET BY MOUTH TWICE A DAY  180 tablet  3  . ramipril (ALTACE) 10 MG tablet Take 10 mg by mouth daily.        . tamsulosin (FLOMAX) 0.4 MG CAPS capsule TAKE 1 TABLET BY MOUTH EVERY DAY  15 capsule  0   No current facility-administered medications for this visit.   Soc hx married. He has children. He has recently moved to independent living  He does not use cigarettes or alcohol    No Known Allergies  Review of Systems negative except from HPI and PMH  Physical Exam BP 130/64  Pulse 73  Ht 5' 5" (1.651 m)  Wt 186 lb 6.4 oz (84.55 kg)  BMI 31.02 kg/m2 Alert and oriented in no acute distress HENT- normal Eyes- EOMI, without scleral icterus Skin- warm and dry; without rashes   LN-neg Neck- supple without thyromegaly, JVP-flat, carotids brisk and full without bruits Back-without CVAT or kyphosis Lungs-clear to auscultation CV-Regular rate and rhythm, nl S1 and single S2 and a 2/6 systolic murmur that radiates to the neck and to the apex gallops or rubs, S4-present Abd-soft with active bowel sounds; no midline pulsation or hepatomegaly Pulses-intact femoral and distal MKS-without gross deformity Neuro- Ax O, CN3-12 intact, grossly normal motor and sensory function Affect engaging     Assessment and  Plan  

## 2013-06-05 ENCOUNTER — Ambulatory Visit (INDEPENDENT_AMBULATORY_CARE_PROVIDER_SITE_OTHER): Payer: Medicare Other | Admitting: *Deleted

## 2013-06-05 DIAGNOSIS — I2589 Other forms of chronic ischemic heart disease: Secondary | ICD-10-CM

## 2013-06-05 DIAGNOSIS — I5022 Chronic systolic (congestive) heart failure: Secondary | ICD-10-CM

## 2013-06-05 DIAGNOSIS — Z95810 Presence of automatic (implantable) cardiac defibrillator: Secondary | ICD-10-CM

## 2013-06-05 LAB — PACEMAKER DEVICE OBSERVATION
AL IMPEDENCE PM: 475 Ohm
AL THRESHOLD: 1.25 V
ATRIAL PACING PM: 0.48
BAMS-0001: 150 {beats}/min
BATTERY VOLTAGE: 3.0404 V
RV LEAD THRESHOLD: 1.5 V

## 2013-06-05 NOTE — Progress Notes (Signed)
Pt seen in device clinic for follow up of recently replaced CRT-P.  Wound well healed.  No redness, swelling, or edema.  Steri-strips removed today.   Device interrogated and found to be functioning normally.  Changed all max lead impedances from 1500 ohms to 2000 ohms. See PaceArt for full details.  Pt denies chest pain, shortness of breath, palpitations, or dizziness.  [facility] 09/05/13 & ROV w/ Dr. Graciela Husbands in 29mo.    Dylan Jenkins 06/05/2013 3:58 PM

## 2013-06-15 ENCOUNTER — Encounter: Payer: Self-pay | Admitting: Internal Medicine

## 2013-06-15 NOTE — CV Procedure (Signed)
Johnston Maddocks [place] [id number]  [id number]  Preop Dx: Previously implanted CRT D. now at [facility]  Postop Dx same/   Procedure: CRT-D generator removal CRT P. generator insertion  Cx: None     Sherryl Manges, MD 06/15/2013 4:39 PM    Pt underwemnt  Generator replacement with removal of his CRT defibrillator with implantation of a CRT pacemaker. Parameters are available on the paper chart are not recalled at this time. The pocket was copious irrigated with antibiotic containing saline solution hemostasis was assured. It may the pulse generator replacement pocket suture the prepectoral fascia was closed. A dressing was applied. Patient tolerated procedure without apparent complication.

## 2013-08-05 ENCOUNTER — Emergency Department: Payer: Self-pay | Admitting: Emergency Medicine

## 2013-08-05 LAB — CBC WITH DIFFERENTIAL/PLATELET
HGB: 13.6 g/dL (ref 13.0–18.0)
Lymphocyte #: 2.1 10*3/uL (ref 1.0–3.6)
Lymphocyte %: 25.4 %
MCH: 28.4 pg (ref 26.0–34.0)
MCHC: 32.9 g/dL (ref 32.0–36.0)
MCV: 87 fL (ref 80–100)
Monocyte #: 0.6 x10 3/mm (ref 0.2–1.0)
Monocyte %: 7.5 %
Neutrophil %: 63.5 %
Platelet: 251 10*3/uL (ref 150–440)
RDW: 13.9 % (ref 11.5–14.5)
WBC: 8.2 10*3/uL (ref 3.8–10.6)

## 2013-08-05 LAB — BASIC METABOLIC PANEL
Anion Gap: 8 (ref 7–16)
Calcium, Total: 8.8 mg/dL (ref 8.5–10.1)
Chloride: 100 mmol/L (ref 98–107)
Co2: 26 mmol/L (ref 21–32)
EGFR (Non-African Amer.): 39 — ABNORMAL LOW
Glucose: 238 mg/dL — ABNORMAL HIGH (ref 65–99)
Sodium: 134 mmol/L — ABNORMAL LOW (ref 136–145)

## 2013-08-05 LAB — TROPONIN I: Troponin-I: <0.02 ng/mL

## 2013-08-12 NOTE — Patient Instructions (Signed)
Family to be contacted regarding memory loss evaluation at [facility]. Continue same medications and return in 6 months.

## 2013-08-12 NOTE — Progress Notes (Signed)
   Subjective:    Patient ID: Lovena Neighboursaul M Bounds, male    DOB: Mar 28, 1926, 80 y.o.   MRN: [id number]  HPI 80 year old male with history of type 2 diabetes mellitus, atherosclerotic heart disease, peripheral vascular disease, hypertension, congestive heart failure, hyperlipidemia, dementia, chronic kidney disease stage III, spinal stenosis for recheck appointment. I have not seen him in some time. He and his wife are currently living in [place] at an assisted living facility. She also has dementia. Recently has come to my attention through family that he is having some issues driving. Sometimes he gets lost. Has on automatic implantable cardiac defibrillator. History of right bundle branch block. History of aortic stenosis.    Review of Systems     Objective:   Physical Exam He is well dressed. Alert and oriented. Very pleasant. Has gained 3 pounds since July 2013. Skin is warm and dry. Nodes none. Neck is supple without JVD thyromegaly or carotid bruits. Chest clear to auscultation. Cardiac exam regular rate and rhythm. Extremities without edema. Diabetic foot exam no ulcers or calluses.       Assessment & Plan:  Dementia-based on family giving history of getting lost while driving. This is concerning.  Atherosclerotic heart disease  Peripheral vascular disease  History of congestive heart failure  History of implantable cardiac defibrillator  Chronic kidney disease stage III  History of aortic stenosis  History of spinal stenosis  Osteoarthritis  Right bundle branch block  Diabetes mellitus  Hypertension  Hyperlipidemia  Plan: Return in 6 months for physical examination. Recommend neurology consultation. Have asked family to contact [facility] memory disorders clinic.

## 2013-09-05 ENCOUNTER — Encounter: Payer: Medicare Other | Admitting: *Deleted

## 2013-09-15 ENCOUNTER — Other Ambulatory Visit: Payer: Self-pay | Admitting: Internal Medicine

## 2014-02-06 ENCOUNTER — Other Ambulatory Visit: Payer: Self-pay | Admitting: Internal Medicine

## 2014-04-10 DEATH — deceased

## 2014-07-19 ENCOUNTER — Encounter (HOSPITAL_COMMUNITY): Payer: Self-pay | Admitting: Internal Medicine

## 2014-11-29 IMAGING — CT CT HEAD WITHOUT CONTRAST
1 series · 16 of 30 positions shown, 20 images · non-contrast
Comparison: 01/09/2012

CLINICAL DATA: Weakness, lightheadedness, dizziness

EXAM:
CT HEAD WITHOUT CONTRAST
TECHNIQUE: Contiguous axial images were obtained from the base of the skull
through the vertex without contrast.

[Series 2: head wo · axial · 0.44mm/px · z∈[-135,-9]mm · 16 of 32 slices shown, 20 images]
[im 2/32  brain]
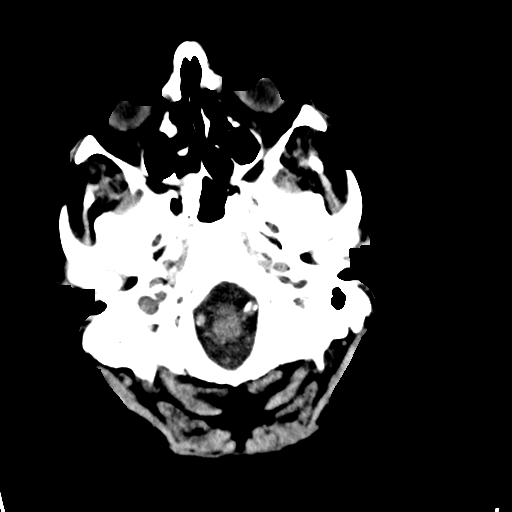
[im 2/32  bone]
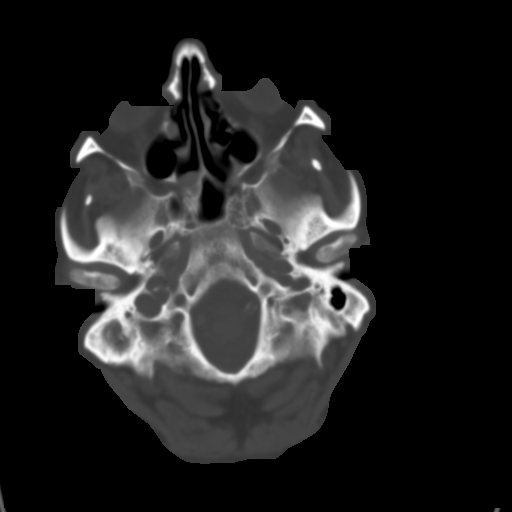
[im 4/32  brain]
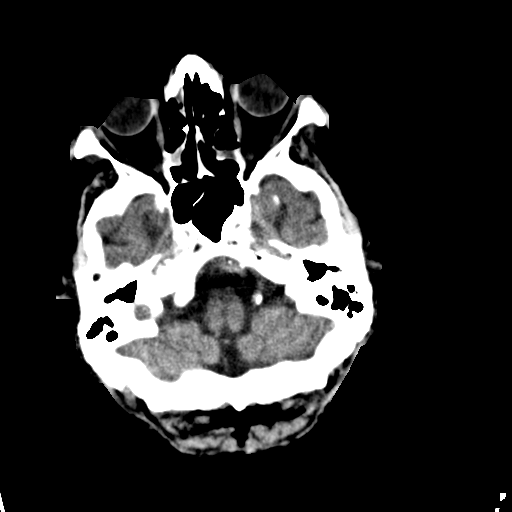
[im 6/32  brain]
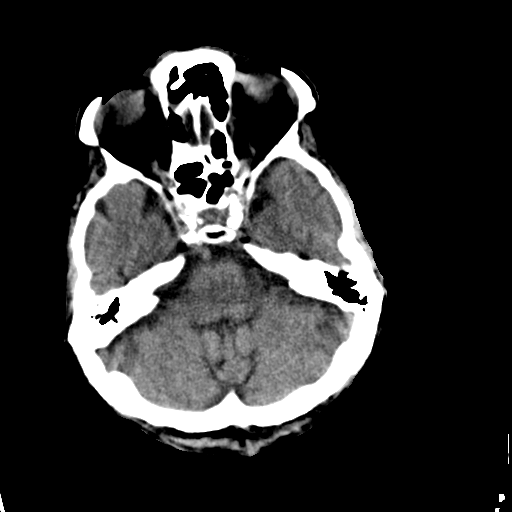
[im 8/32  brain]
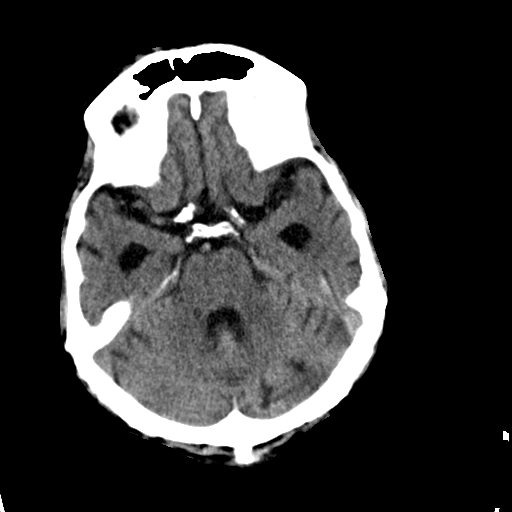
[im 9/32  brain]
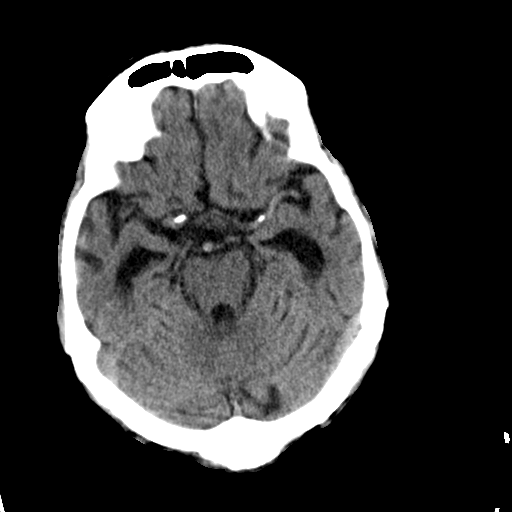
[im 9/32  bone]
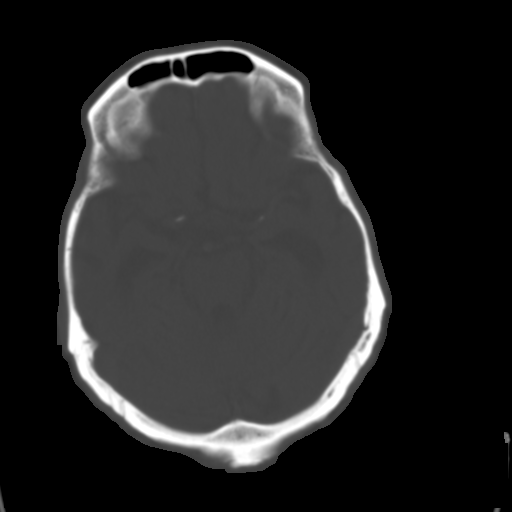
[im 11/32  brain]
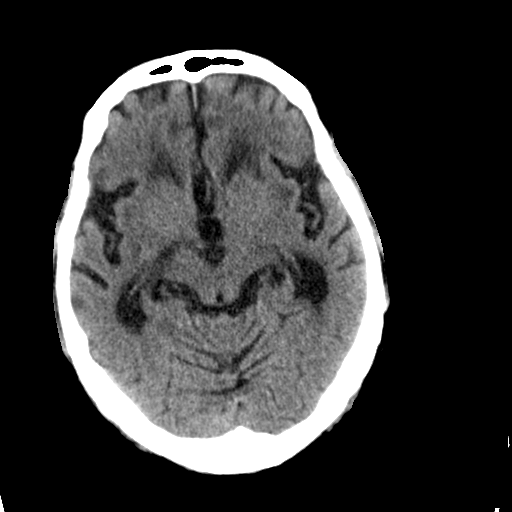
[im 13/32  brain]
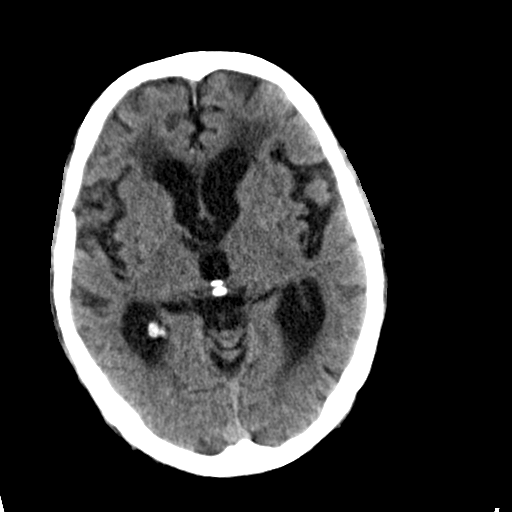
[im 15/32  brain]
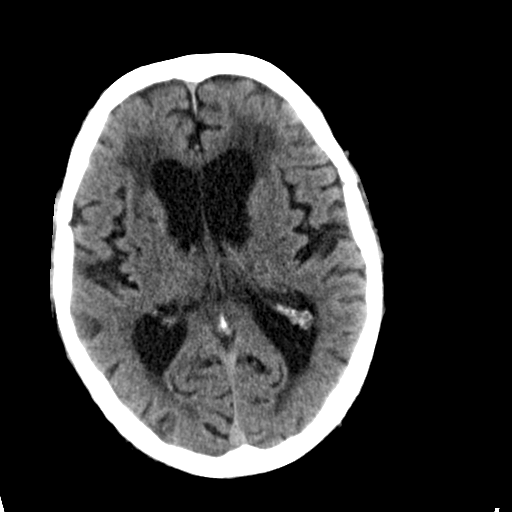
[im 17/32  brain]
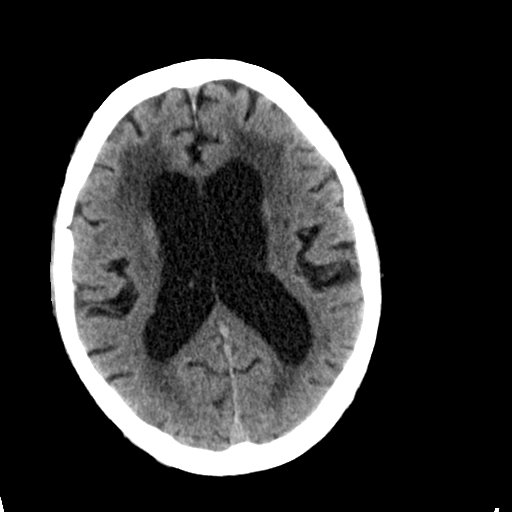
[im 17/32  bone]
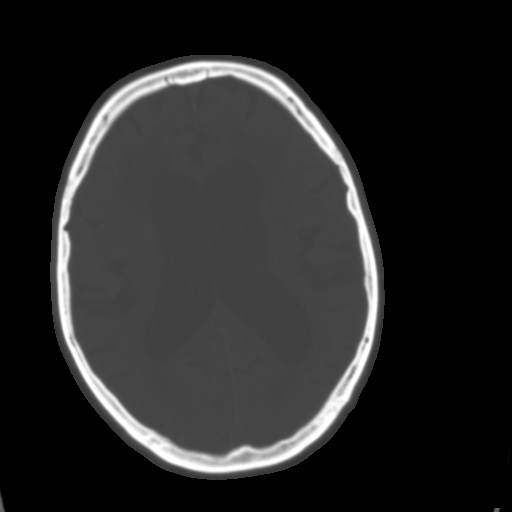
[im 19/32  brain]
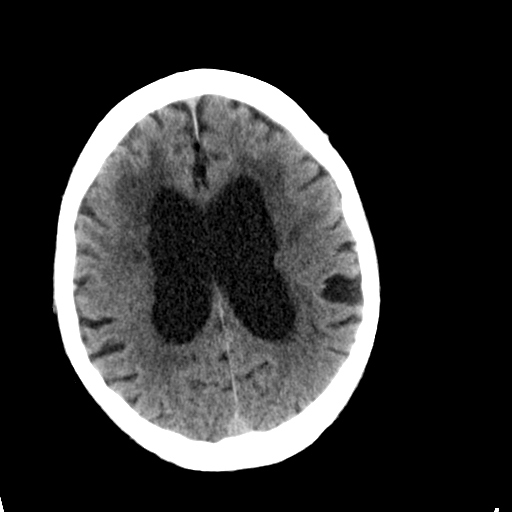
[im 21/32  brain]
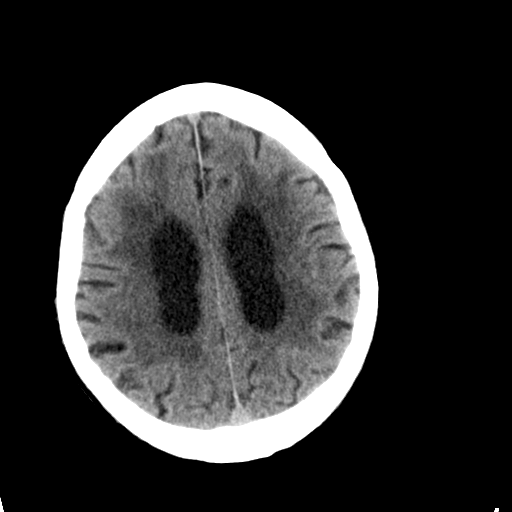
[im 23/32  brain]
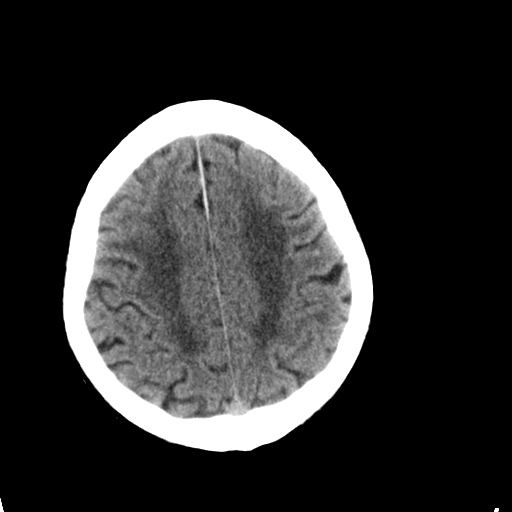
[im 24/32  brain]
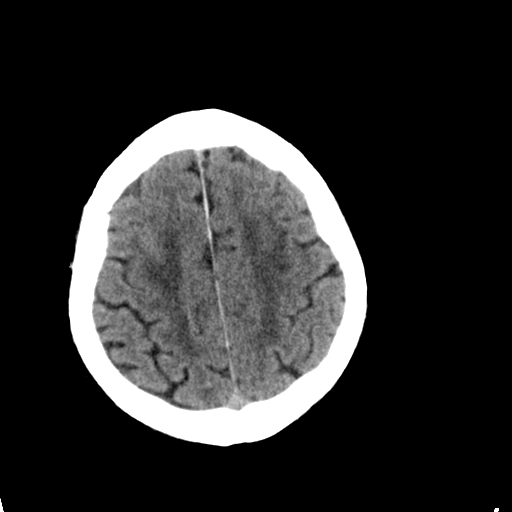
[im 24/32  bone]
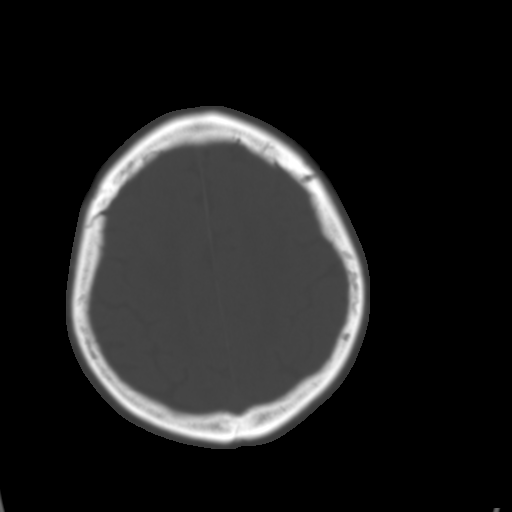
[im 26/32  brain]
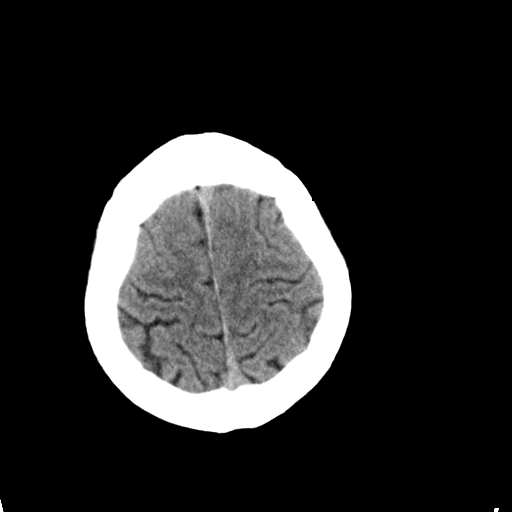
[im 28/32  brain]
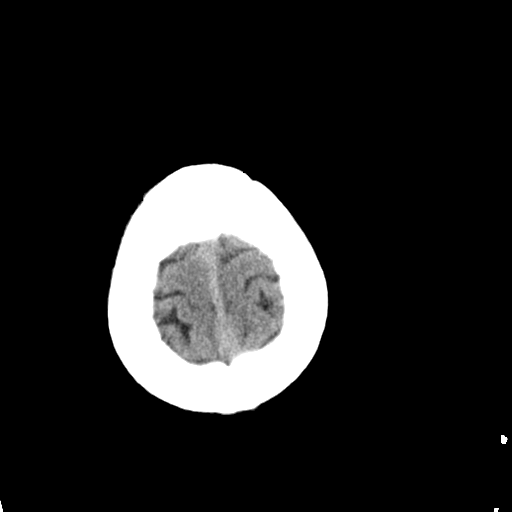
[im 30/32  brain]
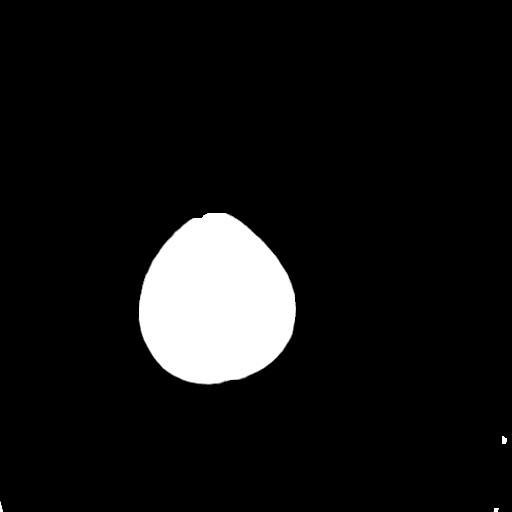

[16 of 30 positions shown; findings below may reference images not displayed]

FINDINGS: Age related brain atrophy with extensive diffuse white matter
chronic microvascular ischemic changes. Stable associated
ventricular enlargement. No acute intracranial hemorrhage, mass
lesion, definite infarction, midline shift, herniation, or
extra-axial fluid collection. Cerebellar atrophy as well.
Atherosclerosis of the intracranial vessels at the skull base.
Mastoids clear. Minor ethmoid mucosal thickening otherwise sinuses
are clear.
IMPRESSION: Chronic atrophy and white matter microvascular ischemic changes. No
interval change or acute process

## 2014-12-02 NOTE — Consult Note (Signed)
Brief Consult Note: Diagnosis: Bilateral foot pain.   Patient was seen by consultant.   Recommend further assessment or treatment.   Orders entered.   Comments: Patient is an 81 y/o male with two years of bilateral foot pain.  He denies recent injuries or trauma.  He states that he has bilateral neuropathy and is being treated for neuropathic pain with Lyrica.  He [place] "foot [place]" [place].   Patient had difficulty getting up with PT today because of pain.  He has diabetes and a history of nine cardiac bypass surgeries with vein harvest in the right leg.  On exam, patient has healed vein harvest scars.  He has mild diffuse edema in both feet without erythema or ecchymosis.  His skin is intact and there are no ulcers.  He has hypersensitivity to light touch in both feet.  He can flex and extend his toes with discomfort.  His foot and leg compartments are soft.  He has palpable pedal pulses bilaterally and his feet are warm to touch.  There are no obvious deformities to either foot.  Radiology:  Left foot films demonstrate diffuse degenerative changes with severe degenerative change in the 1st MTP joint which appears old or chronic.  It is likely a neuropathic joint.  There are no acute fractures or dislocations seen.   My impression is that this is neuropathic pain.  There do not appear to be any acute orthopaedic issues that require intervention.  I would recommend neurology evaluation and pain management consult.  He may WBAT with PT but may require a walker and a period of non-weight bearing with a wheelchair until his neuropathic pain is better controlled.  Electronic Signatures: Juanell FairlyKrasinski, Natalie Mceuen (MD)  (Signed 03-Jun-13 20:05)  Authored: Brief Consult Note   Last Updated: 03-Jun-13 20:05 by Juanell FairlyKrasinski, Bao Coreas (MD)

## 2014-12-02 NOTE — H&P (Signed)
PATIENT NAME:  Dylan Jenkins, Dylan Jenkins MR#:  [id number] DATE OF BIRTH:  [birth year]  DATE OF ADMISSION:  01/09/2012  PRIMARY CARE PHYSICIAN:  Dr. Germaine Pomfret Baxley in [place]  HISTORY OF PRESENT ILLNESS:  The patient is an 81 year old Caucasian male with past medical history significant for history of congestive heart failure, history of cardiomyopathy, ejection fraction 25%, history of coronary artery disease, hypertension, hyperlipidemia, and diabetes who presented to hospital with patient's family's complaints of altered mental status. Apparently the patient was brought to [facility] Emergency Room yesterday after he had was found at home unkempt, lying in bed in a pool of diarrheal stool.  His medications were noted to be not touched over the past one week and he was complaining of some nausea. As he was very weak he was brought to the Emergency Room for further evaluation. In the Emergency Room he was given IV fluids; however, he was noted to be not able to urinate and had to have catheterization of his bladder. Still he was discharged back home despite the patient's family's claims of inability to take care of him. Now he comes back to the hospital because he was having more confusion/altered mental status, and he was also noted to have left facial droop. According to the Emergency Room physician, he may have also had a syncopal episode in the facility. However, according to the patient's family he was more lethargic. He was weak but did not pass out. He was complaining of some left foot pain. He was also noted to have bilateral lower extremity swelling, but no syncope was reported. Apparently the patient was diagnosed with a tooth abscess recently and was started on amoxicillin as well as hydrocodone and ibuprofen. He was given medications but developed diarrhea and it is unclear if he took any amoxicillin recently. He denies any abdominal pains, however admits to nausea. He tells me that he  has been having nausea for a while now. He is also complaining of weight loss. He states that he probably lost approximately 10 pounds in the past one month because of poor appetite as well as nausea. He also has been coughing and having some phlegm production which is greenish color. Because of his nausea and vomiting and diarrhea he presented to the hospital for further evaluation. The patient has been having diarrhea for the past two days at least. Yesterday, as mentioned above, he was found in a pool of stool. Today he is also noted to have watery diarrhea but no black or tarry stools. No blood in the stool was noted by family members. Hospitalist services were contacted for admission.   PAST MEDICAL HISTORY:  1. History of congestive heart failure.  2. History of cardiomyopathy, ejection fraction of 25%.  3. History of hypertension.  4. Diabetes mellitus.  5. Coronary artery disease, status post bypass times nine. 6. History of pacemaker placement.  7. Hyperlipidemia.   MEDICATIONS:  Unknown.   PAST SURGICAL HISTORY:  1. Coronary artery bypass graft.  2. History of peripheral vascular disease and stent placement.   ALLERGIES: None.   FAMILY HISTORY: Negative for early coronary artery disease, hypertension, hyperlipidemia, or cerebrovascular accident. The patient's father died of at the age of  83 of cardiac problems. The patient's mother died of old age at the age of 95.   SOCIAL HISTORY: The patient is married. He lives with his wife who was recently diagnosed with dementia and not able to take care of him. He used  to smoke, at least for 40 years, quit in 1980s. Occasional alcohol abuse.  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  REVIEW OF SYSTEMS: Positive for left foot pain, bilateral pain in fact in lower extremities as well as lower extremity swelling. Some diarrhea, nausea, poor appetite, weight loss, cough, and greenish phlegm production. It is unclear how long he has  been having this nausea, intermittent vomiting, dry heaving over the past two days at least. Urinary retention. He had to have catheterization yesterday. A lot of urine was catheterized. Today, again, he is not able to pass urine. Otherwise denies fevers, chills, fatigue, weakness, or weight gain. EYES: Denies any blurry vision, double vision, glaucoma, or cataracts. ENT: Denies any tinnitus, allergies, epistaxis, sinus pain, dentures, or difficulty swallowing. RESPIRATORY: Denies any wheezes, asthma, or chronic obstructive pulmonary disease. CARDIOVASCULAR: Denies any chest pains, orthopnea, arrhythmias, palpitations, or syncope. GASTROINTESTINAL: Denies any abdominal pain, rectal bleeding, or change in bowel habits. GENITOURINARY: Denies dysuria, hematuria, frequency, or incontinence. ENDOCRINE: Denies polydipsia, nocturia, thyroid problems, heat or cold intolerance, or thirst. HEMATOLOGIC: Denies anemia, easy bruising, bleeding, or swollen glands. SKIN: Denies any acne, rashes, lesions, or change in moles.  MUSCULOSKELETAL: Denies arthritis, cramps, or swelling. NEUROLOGIC: No numbness, epilepsy, or tremor. PSYCH: Denies anxiety, insomnia, or depression.   PHYSICAL EXAMINATION:  VITAL SIGNS: On arrival to the hospital, temperature 97.8, pulse 84, respiration rate 22, blood pressure 154/71, saturation 98% on room air.   GENERAL: This is a well-developed, well-nourished Caucasian male in no significant distress lying on the stretcher. He is somewhat confused and lost and somewhat spacey.   HEENT: His pupils are equal and reactive to light. Extraocular movements intact. No icterus or conjunctivitis. He has some difficulty hearing. No pharyngeal erythema. Mucosa is very dry.   NECK: Neck did not reveal any masses. Supple, nontender. Thyroid not enlarged. No adenopathy. No jugular venous distention. The patient does have full range of motion.   LUNGS: Clear to auscultation in all fields. Somewhat  diminished breath sounds but no significant rales or rhonchi or wheezing were noted. No labored inspirations, increased effort, dullness to percussion, or overt respiratory distress.   CARDIOVASCULAR: S1, S2 appreciated. Systolic murmur was heard, 4/6 systolic murmur on the mitral auscultation site, somewhat radiating to the axilla. Chest is nontender to palpation. Diminished pedal pulses.   EXTREMITIES: Trace to 1+ lower extremity edema bilaterally and significant tenderness to palpation even on superficial palpation, but no significant calf tenderness or joint tenderness was noted.   ABDOMEN: Soft, nontender. Bowel sounds are present. No hepatosplenomegaly or masses were noted.   RECTAL: Deferred.   MUSCULOSKELETAL: Able to move all extremities. No cyanosis, degenerative joint disease, or kyphosis. Gait is not tested.   SKIN: Skin did not reveal any rashes, lesions, erythema, nodularity, or induration. It was warm and dry to palpation. Significant tenderness was noted on superficial palpation of lower extremities.   LYMPH: No adenopathy in the cervical region.   NEUROLOGICAL: Cranial nerves grossly intact. Sensory is intact. The patient does have some slurring of speech but no expressive aphasia was noted. The patient is alert, somewhat disoriented, poorly cooperative. Memory is impaired. The patient did have some confusion but no agitation or depression noted.   LABORATORY DATA: BMP done today on 01/09/2012 showed glucose 273, BUN and creatinine were 23 and 1.56, otherwise BMP was unremarkable. The patient's estimated GFR was 40. Calcium level was low at 8.3. Yesterday BUN and creatinine were 31 and  1.57 with glucose level of 200. The patient's liver studies showed albumin level of 3.2, otherwise liver enzymes were unremarkable. Cardiac enzymes times one today were normal. Cardiac enzymes yesterday as well were normal. TSH was normal at 0.633. White blood cell count is normal at 8.3, hemoglobin  12.5, platelet count 229. Absolute neutrophil count is 6.2, which was also within normal limits. Urinalysis was done on the 31st of May 2013 and at that time showed yellow, hazy urine with 50 mg /deciliter glucose, negative for bilirubin, trace ketones. 1.017 specific gravity, 5 pH. Negative for blood, 30 mg/dL protein, negative for nitrites or leukocyte esterase, 1 red blood cell, 3 white blood cells, trace bacteria noted. No epithelial cells. Mucous was present. Six hyaline casts, six granular casts, and one white blood cell cast was noted. Urinalysis today was not taken yet and urine cultures are not sent. EKG: Electronic ventricular pacemaker.   RADIOLOGIC STUDIES: Chest x-ray PA and lateral 01/08/2012 showed cardiomegaly, prior coronary artery bypass grafting, pacemaker, no congestive heart failure or focal infiltrates were noted. CT scan of head without contrast 01/08/2012: No acute intracranial process. Chronic small vessel ischemic disease was noted. Repeat CT of the head without contrast 01/09/2012 showed white matter changes noted with chronic ischemia. 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   ASSESSMENT AND PLAN:  1. Altered mental status with left facial droop, which was reported in the Emergency Room, as well as slurring of speech on arrival to the Emergency Room. CT of head was unremarkable. It is unclear if the patient had transient ischemic attack versus metabolic encephalopathy. We will admit the patient to the medical floor. We will get a swallowing test done at the bedside to evaluate his abilities. As mentioned above, the patient's head CT did not show any new changes; however, old infarcts were noted. It is unclear if the patient had transient ischemic attack versus stroke. We will follow his clinical condition. We will get an echo as well as carotid ultrasound and we will have MRI of the brain done if the patient's condition does not improve. The patient apparently had Burke's swallowing study  already at bedside here in the Emergency Room and he passed it. However, he was noted later on to have some cough with fluid ingestion so the concern was that the patient may have some aspiration symptoms and we will be restricting his oral intake at this time, until he is seen by a speech therapist on the floor. However, we cannot rule out metabolic encephalopathy including related to gastroenteritis versus urinary tract infection. We will be watching him carefully and, again, we will get MRI of brain if needed. We will also continue aspirin therapy.  2. Acute gastroenteritis. We will get stool cultures as well as guaiac and Clostridium difficile. We will continue IV fluids.  3. Dehydration. We will continue IV fluids. 4. Tooth abscess. We will start the patient on Unasyn IV. We will change to Augmentin prior to discharging home.  5. Renal insufficiency and urinary retention. The patient's bladder scan noted him to have more than 400 mL of urine. We will place a Foley catheter and we will start the patient on medications for prostate hypertrophy. The patient would benefit from urology evaluation as an outpatient, which we would need to schedule prior to discharge from the hospital. We will also send urine for cultures and we will continue the patient on Unasyn as mentioned above. Adjust antibiotics according to culture results.  6. Generalized, weakness. We will  get physical therapist involved. The patient may benefit from rehabilitation placement.  7. Cardiomyopathy with ejection fraction of 25% in the past. As mentioned above, we will be repeating echocardiogram.  8. History of hypertension. The patient's blood pressure readings are higher than we would like them to be, possibly stress related. However, the patient would benefit from blood pressure medications such as Coreg and ACE inhibitor. We do not have the patient's medication list, unfortunately, so we will be initiating him on Coreg as well as  hydralazine and Imdur for now because of renal insufficiency.  9. History of hyperlipidemia. Awaiting  patient's medication list.  10. History of diabetes mellitus. We will continue the patient on sliding scale insulin. For now we will hold Lantus.   TIME SPENT: 50 minutes.   ____________________________ Katharina Caperima Kimyetta Flott, MD rv:bjt D: 01/09/2012 18:44:14 ET T: 01/10/2012 08:26:13 ET JOB#: [id number]  cc: Katharina Caperima Lucille Crichlow, MD, <Dictator> Joanna HewsMary Jane Baxley, MD, [place] Rider Ermis MD ELECTRONICALLY SIGNED 01/12/2012 19:06

## 2014-12-02 NOTE — Discharge Summary (Signed)
PATIENT NAME:  Dylan Jenkins, Hughes M MR#:  [id number] DATE OF BIRTH:  11-14-1925  DATE OF ADMISSION:  01/09/2012 DATE OF DISCHARGE:  01/13/2012  PRESENTING COMPLAINT: Gastroenteritis.   DISCHARGE DIAGNOSES:  1. Acute gastroenteritis, resolved.  2. Dehydration, improved.  3. Hypertension.  4. Benign prostatic hypertrophy.  5. Bilateral leg pain, left more than right, likely neuropathic pain.  6. Fall/ambulation dysfunction. 7. Benign prostatic hypertrophy.  8. Type 2 diabetes.  9. Diabetic neuropathy.  10. Severe ischemic cardiomyopathy with ejection fraction of 25-35% by echo, status post pacemaker.   CONDITION ON DISCHARGE: Fair. Vitals stable.   CODE STATUS: The patient is a FULL CODE.  VITAL SIGNS: Blood pressure 124/70, sats 97% on room air, pulse 63.   LABORATORY AND RADIOLOGICAL FINDINGS:  X-ray right foot shows no definite acute bony abnormality. There is deformity of the MP joint of the great toe, likely secondary to residual changes from prior surgery.  X-ray left foot shows degenerative changes of the left foot. There are findings in the region of the base of the fifth metatarsal.   Glucose 138, BUN 18, creatinine 1.28, sodium 139, potassium 4, chloride 106, bicarbonate 24, calcium 8.3. Cardiac enzymes times three negative. An ultrasound carotid Doppler: No significant stenosis identified.   Echo Doppler showed ejection fraction of 25 to 35%. There is severe global hypokinesis of the left ventricle. There is inferior posterior wall akinesis.  Pacemaker lead in the right ventricle is present.   Hemoglobin and hematocrit 11.4 and 34.7. White count is 7.7. Glucose 156, BUN 21, creatinine 1.29, sodium 141, potassium 3.9. Urinalysis negative for urinary tract infection. Urine culture negative. CT of the head shows no acute changes noted. Old thalamic infarct.   Chest x-ray: No acute abnormality.   Creatinine on admission was 1.56. TSH is 6.33. PSA is 0.9. Hemoglobin A1c 8.7.    DISCHARGE MEDICATIONS: 1. Prednisone taper 50 mg, taper by 10 mg p.o. daily, then stop. This is for left foot pain due to arthritis.  2. Simvastatin 80 mg at bedtime.  3. Omeprazole 20 mg daily.  4. Glipizide XL 5 mg daily.  5. Lasix 40 mg daily.  6. Cardura 2 mg daily.  7. Wellbutrin XL 150 mg p.o. daily.  8. Gabapentin 300 mg t.i.d.  9. Digoxin 0.125 mg p.o. daily.  10. Lisinopril 20 mg daily.  11. Carvedilol 3.125 p.o. b.i.d.  12. Aspirin 81 mg daily.  13. Sliding scale insulin.   ACTIVITY: Physical therapy.   DIET:  Mechanical soft, 1800 calorie diet.  CONSULTATIONS:  1. Physical therapy. 2. Speech therapy.  3. Orthopedic consultation with Dr. Martha ClanKrasinski due to bilateral foot pain.   BRIEF SUMMARY OF HOSPITAL  COURSE: Mr. Dylan Jenkins is an 81 year old Caucasian gentleman with past medical history of cardiomyopathy, history of coronary artery disease status post coronary artery bypass graft who comes to the Emergency Room,  admitted with:  1. Acute gastroenteritis: The patient was admitted, started on IV fluids. He was dehydrated. After admission, the patient did not have any diarrheal stools and stool studies could not be sent. No fever, nausea, or vomiting noted. Dehydration improved after IV fluids.  2. Acute renal failure: Improved after IV hydration, suspected due to gastroenteritis.  3. Hypertension:  The patient initially was hypotensive because of dehydration and his medications were resumed once blood pressure was stable.   4. Benign prostatic hypertrophy:  Doxazosin was continued.  5. Fall with ambulation dysfunction and bilateral foot pain with multiple surgeries in both the lower  extremities: Physical therapy evaluated the patient and recommended rehab for which patient is going to be discharged to [facility].  6. Type 2 diabetes: The patient was on glipizide and sliding scale.  7. Diabetic neuropathy: Continued on Neurontin.  8. Bilateral foot pain, left more than  right. The patient was evaluated by orthopedics, felt more neuropathic pain.  9. We started a trial of  prednisone, which did help the patient quite a bit with his pain. He was continued on gabapentin.  10. The patient remained a FULL CODE.  11. Hospital stay otherwise remained stable.  DISPOSITION:  He is going to be discharged to [facility]. The discharge plan was discussed with the patient's son who is agreeable to it.  TIME SPENT: 40 minutes.  ____________________________ Wylie Hail Allena Katz, MD sap:bjt D: 01/13/2012 10:53:47 ET T: 01/13/2012 11:22:15 ET JOB#: [id number]  cc: Joanna Hews, MD, Earnstine Regal, MD Willow Ora MD ELECTRONICALLY SIGNED 01/16/2012 14:12

## 2015-02-13 ENCOUNTER — Encounter: Payer: Self-pay | Admitting: Internal Medicine
